# Patient Record
Sex: Female | Born: 1960 | Race: Black or African American | Hispanic: No | Marital: Married | Smoking: Never smoker
Health system: Southern US, Community
[De-identification: ages and names within clinical notes are randomized; demographics above are authoritative.]

## PROBLEM LIST (undated history)

## (undated) DIAGNOSIS — I10 Essential (primary) hypertension: Secondary | ICD-10-CM

## (undated) DIAGNOSIS — E119 Type 2 diabetes mellitus without complications: Secondary | ICD-10-CM

## (undated) DIAGNOSIS — E039 Hypothyroidism, unspecified: Secondary | ICD-10-CM

## (undated) HISTORY — PX: HERNIA REPAIR: SHX51

## (undated) HISTORY — PX: SHOULDER SURGERY: SHX246

---

## 1997-10-27 ENCOUNTER — Encounter: Admission: RE | Admit: 1997-10-27 | Discharge: 1997-10-27 | Payer: Self-pay | Admitting: Family Medicine

## 1997-11-02 ENCOUNTER — Encounter: Admission: RE | Admit: 1997-11-02 | Discharge: 1997-11-02 | Payer: Self-pay | Admitting: Sports Medicine

## 1997-11-22 ENCOUNTER — Encounter: Admission: RE | Admit: 1997-11-22 | Discharge: 1997-11-22 | Payer: Self-pay | Admitting: Family Medicine

## 1997-12-01 ENCOUNTER — Encounter: Admission: RE | Admit: 1997-12-01 | Discharge: 1997-12-01 | Payer: Self-pay | Admitting: Family Medicine

## 1997-12-08 ENCOUNTER — Encounter: Admission: RE | Admit: 1997-12-08 | Discharge: 1997-12-08 | Payer: Self-pay | Admitting: Family Medicine

## 1998-01-05 ENCOUNTER — Encounter: Admission: RE | Admit: 1998-01-05 | Discharge: 1998-01-05 | Payer: Self-pay | Admitting: Family Medicine

## 1998-01-15 ENCOUNTER — Emergency Department (HOSPITAL_COMMUNITY): Admission: EM | Admit: 1998-01-15 | Discharge: 1998-01-15 | Payer: Self-pay | Admitting: Family Medicine

## 1998-03-31 ENCOUNTER — Encounter: Admission: RE | Admit: 1998-03-31 | Discharge: 1998-03-31 | Payer: Self-pay | Admitting: Family Medicine

## 1998-03-31 ENCOUNTER — Emergency Department (HOSPITAL_COMMUNITY): Admission: EM | Admit: 1998-03-31 | Discharge: 1998-03-31 | Payer: Self-pay | Admitting: Emergency Medicine

## 1998-03-31 ENCOUNTER — Encounter: Payer: Self-pay | Admitting: Emergency Medicine

## 1998-04-08 ENCOUNTER — Encounter: Admission: RE | Admit: 1998-04-08 | Discharge: 1998-04-08 | Payer: Self-pay | Admitting: Family Medicine

## 1998-04-21 ENCOUNTER — Encounter: Admission: RE | Admit: 1998-04-21 | Discharge: 1998-04-21 | Payer: Self-pay | Admitting: Family Medicine

## 1998-05-13 ENCOUNTER — Encounter: Admission: RE | Admit: 1998-05-13 | Discharge: 1998-05-13 | Payer: Self-pay | Admitting: Family Medicine

## 1998-08-23 ENCOUNTER — Ambulatory Visit (HOSPITAL_BASED_OUTPATIENT_CLINIC_OR_DEPARTMENT_OTHER): Admission: RE | Admit: 1998-08-23 | Discharge: 1998-08-23 | Payer: Self-pay | Admitting: Orthopedic Surgery

## 1998-08-31 ENCOUNTER — Encounter: Admission: RE | Admit: 1998-08-31 | Discharge: 1998-11-30 | Payer: Self-pay | Admitting: Orthopedic Surgery

## 1998-12-02 ENCOUNTER — Encounter: Admission: RE | Admit: 1998-12-02 | Discharge: 1999-03-02 | Payer: Self-pay | Admitting: Orthopedic Surgery

## 1999-01-11 ENCOUNTER — Encounter: Admission: RE | Admit: 1999-01-11 | Discharge: 1999-01-11 | Payer: Self-pay | Admitting: Family Medicine

## 1999-01-12 ENCOUNTER — Encounter: Admission: RE | Admit: 1999-01-12 | Discharge: 1999-01-12 | Payer: Self-pay | Admitting: Family Medicine

## 1999-01-26 ENCOUNTER — Encounter: Admission: RE | Admit: 1999-01-26 | Discharge: 1999-01-26 | Payer: Self-pay | Admitting: Family Medicine

## 2000-06-14 ENCOUNTER — Encounter: Admission: RE | Admit: 2000-06-14 | Discharge: 2000-06-14 | Payer: Self-pay | Admitting: Family Medicine

## 2000-06-19 ENCOUNTER — Ambulatory Visit (HOSPITAL_COMMUNITY): Admission: RE | Admit: 2000-06-19 | Discharge: 2000-06-19 | Payer: Self-pay | Admitting: Gastroenterology

## 2000-06-21 ENCOUNTER — Encounter: Admission: RE | Admit: 2000-06-21 | Discharge: 2000-06-21 | Payer: Self-pay | Admitting: Family Medicine

## 2000-07-08 ENCOUNTER — Encounter: Admission: RE | Admit: 2000-07-08 | Discharge: 2000-07-08 | Payer: Self-pay | Admitting: Family Medicine

## 2000-12-06 ENCOUNTER — Ambulatory Visit (HOSPITAL_COMMUNITY): Admission: RE | Admit: 2000-12-06 | Discharge: 2000-12-06 | Payer: Self-pay | Admitting: Neurosurgery

## 2001-03-27 ENCOUNTER — Other Ambulatory Visit: Admission: RE | Admit: 2001-03-27 | Discharge: 2001-03-27 | Payer: Self-pay | Admitting: Endocrinology

## 2001-07-30 ENCOUNTER — Encounter: Admission: RE | Admit: 2001-07-30 | Discharge: 2001-07-30 | Payer: Self-pay | Admitting: Family Medicine

## 2001-08-18 ENCOUNTER — Encounter: Admission: RE | Admit: 2001-08-18 | Discharge: 2001-08-18 | Payer: Self-pay | Admitting: Family Medicine

## 2001-09-02 ENCOUNTER — Encounter: Admission: RE | Admit: 2001-09-02 | Discharge: 2001-09-02 | Payer: Self-pay | Admitting: Family Medicine

## 2001-09-08 ENCOUNTER — Encounter: Payer: Self-pay | Admitting: Sports Medicine

## 2001-09-08 ENCOUNTER — Encounter: Admission: RE | Admit: 2001-09-08 | Discharge: 2001-09-08 | Payer: Self-pay | Admitting: Sports Medicine

## 2001-09-30 ENCOUNTER — Encounter: Payer: Self-pay | Admitting: Sports Medicine

## 2001-09-30 ENCOUNTER — Encounter: Admission: RE | Admit: 2001-09-30 | Discharge: 2001-09-30 | Payer: Self-pay | Admitting: Sports Medicine

## 2001-10-03 ENCOUNTER — Encounter (INDEPENDENT_AMBULATORY_CARE_PROVIDER_SITE_OTHER): Payer: Self-pay | Admitting: Specialist

## 2001-10-03 ENCOUNTER — Ambulatory Visit (HOSPITAL_COMMUNITY): Admission: RE | Admit: 2001-10-03 | Discharge: 2001-10-03 | Payer: Self-pay | Admitting: Gastroenterology

## 2001-10-24 ENCOUNTER — Encounter: Admission: RE | Admit: 2001-10-24 | Discharge: 2001-10-24 | Payer: Self-pay | Admitting: Gastroenterology

## 2001-10-24 ENCOUNTER — Encounter: Payer: Self-pay | Admitting: Gastroenterology

## 2002-04-23 ENCOUNTER — Encounter (INDEPENDENT_AMBULATORY_CARE_PROVIDER_SITE_OTHER): Payer: Self-pay

## 2002-04-23 ENCOUNTER — Observation Stay (HOSPITAL_COMMUNITY): Admission: RE | Admit: 2002-04-23 | Discharge: 2002-04-24 | Payer: Self-pay | Admitting: *Deleted

## 2002-04-28 ENCOUNTER — Inpatient Hospital Stay (HOSPITAL_COMMUNITY): Admission: AD | Admit: 2002-04-28 | Discharge: 2002-04-28 | Payer: Self-pay | Admitting: Obstetrics and Gynecology

## 2002-04-28 ENCOUNTER — Encounter: Payer: Self-pay | Admitting: Obstetrics and Gynecology

## 2002-09-08 ENCOUNTER — Encounter: Admission: RE | Admit: 2002-09-08 | Discharge: 2002-09-08 | Payer: Self-pay | Admitting: Family Medicine

## 2002-10-08 ENCOUNTER — Encounter: Admission: RE | Admit: 2002-10-08 | Discharge: 2002-10-08 | Payer: Self-pay | Admitting: Family Medicine

## 2002-12-28 ENCOUNTER — Encounter: Admission: RE | Admit: 2002-12-28 | Discharge: 2002-12-28 | Payer: Self-pay | Admitting: Family Medicine

## 2003-03-18 DIAGNOSIS — Z9889 Other specified postprocedural states: Secondary | ICD-10-CM

## 2003-12-20 ENCOUNTER — Ambulatory Visit: Payer: Self-pay | Admitting: Sports Medicine

## 2004-01-07 ENCOUNTER — Ambulatory Visit: Payer: Self-pay | Admitting: Family Medicine

## 2005-01-24 ENCOUNTER — Ambulatory Visit: Payer: Self-pay | Admitting: Family Medicine

## 2005-01-26 ENCOUNTER — Ambulatory Visit: Payer: Self-pay | Admitting: Family Medicine

## 2005-08-17 ENCOUNTER — Ambulatory Visit: Payer: Self-pay | Admitting: Family Medicine

## 2005-08-27 ENCOUNTER — Ambulatory Visit: Payer: Self-pay | Admitting: Sports Medicine

## 2005-09-17 ENCOUNTER — Encounter (INDEPENDENT_AMBULATORY_CARE_PROVIDER_SITE_OTHER): Payer: Self-pay | Admitting: Gastroenterology

## 2005-09-17 ENCOUNTER — Ambulatory Visit (HOSPITAL_COMMUNITY): Admission: RE | Admit: 2005-09-17 | Discharge: 2005-09-17 | Payer: Self-pay | Admitting: Gastroenterology

## 2005-10-01 ENCOUNTER — Encounter: Admission: RE | Admit: 2005-10-01 | Discharge: 2005-10-01 | Payer: Self-pay | Admitting: Endocrinology

## 2005-10-19 ENCOUNTER — Ambulatory Visit: Payer: Self-pay | Admitting: Family Medicine

## 2006-01-10 ENCOUNTER — Encounter (INDEPENDENT_AMBULATORY_CARE_PROVIDER_SITE_OTHER): Payer: Self-pay | Admitting: *Deleted

## 2006-01-10 LAB — CONVERTED CEMR LAB

## 2006-01-22 ENCOUNTER — Encounter: Admission: RE | Admit: 2006-01-22 | Discharge: 2006-01-22 | Payer: Self-pay | Admitting: Endocrinology

## 2006-01-25 ENCOUNTER — Ambulatory Visit: Payer: Self-pay | Admitting: Family Medicine

## 2006-02-11 ENCOUNTER — Ambulatory Visit: Payer: Self-pay | Admitting: Sports Medicine

## 2006-02-20 ENCOUNTER — Ambulatory Visit (HOSPITAL_COMMUNITY): Admission: RE | Admit: 2006-02-20 | Discharge: 2006-02-21 | Payer: Self-pay | Admitting: General Surgery

## 2006-02-20 ENCOUNTER — Encounter (INDEPENDENT_AMBULATORY_CARE_PROVIDER_SITE_OTHER): Payer: Self-pay | Admitting: *Deleted

## 2006-03-17 DIAGNOSIS — M5412 Radiculopathy, cervical region: Secondary | ICD-10-CM | POA: Insufficient documentation

## 2006-05-09 DIAGNOSIS — I10 Essential (primary) hypertension: Secondary | ICD-10-CM

## 2006-05-09 DIAGNOSIS — K5730 Diverticulosis of large intestine without perforation or abscess without bleeding: Secondary | ICD-10-CM | POA: Insufficient documentation

## 2006-05-10 ENCOUNTER — Encounter (INDEPENDENT_AMBULATORY_CARE_PROVIDER_SITE_OTHER): Payer: Self-pay | Admitting: *Deleted

## 2006-05-27 ENCOUNTER — Telehealth: Payer: Self-pay | Admitting: *Deleted

## 2006-11-13 ENCOUNTER — Ambulatory Visit: Payer: Self-pay | Admitting: Family Medicine

## 2006-11-13 ENCOUNTER — Encounter (INDEPENDENT_AMBULATORY_CARE_PROVIDER_SITE_OTHER): Payer: Self-pay | Admitting: Family Medicine

## 2006-11-13 ENCOUNTER — Telehealth (INDEPENDENT_AMBULATORY_CARE_PROVIDER_SITE_OTHER): Payer: Self-pay | Admitting: *Deleted

## 2006-11-13 LAB — CONVERTED CEMR LAB
ALT: 49 units/L — ABNORMAL HIGH (ref 0–35)
AST: 31 units/L (ref 0–37)
Alkaline Phosphatase: 57 units/L (ref 39–117)
Bilirubin Urine: NEGATIVE
Creatinine, Ser: 1.15 mg/dL (ref 0.40–1.20)
Glucose, Bld: 350 mg/dL — ABNORMAL HIGH (ref 70–99)
Glucose, Urine, Semiquant: 500
HCT: 41.8 % (ref 36.0–46.0)
Hemoglobin: 13.8 g/dL (ref 12.0–15.0)
Ketones, urine, test strip: NEGATIVE
MCHC: 33 g/dL (ref 30.0–36.0)
MCV: 91.3 fL (ref 78.0–100.0)
RBC: 4.58 M/uL (ref 3.87–5.11)
RDW: 13.3 % (ref 11.5–14.0)
TSH: 1.176 microintl units/mL (ref 0.350–5.50)
Total Bilirubin: 0.5 mg/dL (ref 0.3–1.2)
Total Protein: 7.1 g/dL (ref 6.0–8.3)
Urobilinogen, UA: 0.2
pH: 5.5

## 2006-11-19 ENCOUNTER — Encounter (INDEPENDENT_AMBULATORY_CARE_PROVIDER_SITE_OTHER): Payer: Self-pay | Admitting: Family Medicine

## 2006-11-19 ENCOUNTER — Telehealth (INDEPENDENT_AMBULATORY_CARE_PROVIDER_SITE_OTHER): Payer: Self-pay | Admitting: Family Medicine

## 2006-11-19 ENCOUNTER — Ambulatory Visit: Payer: Self-pay | Admitting: Family Medicine

## 2006-11-19 DIAGNOSIS — E119 Type 2 diabetes mellitus without complications: Secondary | ICD-10-CM

## 2006-11-20 LAB — CONVERTED CEMR LAB
CO2: 29 meq/L (ref 19–32)
Calcium: 8.8 mg/dL (ref 8.4–10.5)
Cholesterol, target level: 200 mg/dL
Glucose, Bld: 258 mg/dL — ABNORMAL HIGH (ref 70–99)
LDL Goal: 100 mg/dL
Sodium: 138 meq/L (ref 135–145)
Total CHOL/HDL Ratio: 4.8

## 2006-12-03 ENCOUNTER — Encounter: Admission: RE | Admit: 2006-12-03 | Discharge: 2007-02-19 | Payer: Self-pay | Admitting: Family Medicine

## 2006-12-03 ENCOUNTER — Telehealth (INDEPENDENT_AMBULATORY_CARE_PROVIDER_SITE_OTHER): Payer: Self-pay | Admitting: Family Medicine

## 2006-12-06 ENCOUNTER — Encounter (INDEPENDENT_AMBULATORY_CARE_PROVIDER_SITE_OTHER): Payer: Self-pay | Admitting: *Deleted

## 2006-12-08 ENCOUNTER — Emergency Department (HOSPITAL_COMMUNITY): Admission: EM | Admit: 2006-12-08 | Discharge: 2006-12-08 | Payer: Self-pay | Admitting: Emergency Medicine

## 2006-12-16 ENCOUNTER — Encounter (INDEPENDENT_AMBULATORY_CARE_PROVIDER_SITE_OTHER): Payer: Self-pay | Admitting: Family Medicine

## 2006-12-16 ENCOUNTER — Ambulatory Visit: Payer: Self-pay | Admitting: Family Medicine

## 2006-12-16 ENCOUNTER — Encounter (INDEPENDENT_AMBULATORY_CARE_PROVIDER_SITE_OTHER): Payer: Self-pay | Admitting: *Deleted

## 2006-12-18 ENCOUNTER — Encounter (INDEPENDENT_AMBULATORY_CARE_PROVIDER_SITE_OTHER): Payer: Self-pay | Admitting: Family Medicine

## 2006-12-24 ENCOUNTER — Encounter: Payer: Self-pay | Admitting: *Deleted

## 2006-12-31 ENCOUNTER — Encounter (INDEPENDENT_AMBULATORY_CARE_PROVIDER_SITE_OTHER): Payer: Self-pay | Admitting: Family Medicine

## 2007-01-23 ENCOUNTER — Encounter (INDEPENDENT_AMBULATORY_CARE_PROVIDER_SITE_OTHER): Payer: Self-pay | Admitting: Family Medicine

## 2007-01-27 ENCOUNTER — Encounter (INDEPENDENT_AMBULATORY_CARE_PROVIDER_SITE_OTHER): Payer: Self-pay | Admitting: Family Medicine

## 2007-02-03 ENCOUNTER — Telehealth: Payer: Self-pay | Admitting: *Deleted

## 2007-02-21 ENCOUNTER — Encounter: Admission: RE | Admit: 2007-02-21 | Discharge: 2007-02-22 | Payer: Self-pay | Admitting: Family Medicine

## 2007-02-21 ENCOUNTER — Ambulatory Visit: Payer: Self-pay | Admitting: Family Medicine

## 2007-02-21 DIAGNOSIS — E039 Hypothyroidism, unspecified: Secondary | ICD-10-CM | POA: Insufficient documentation

## 2007-02-21 LAB — CONVERTED CEMR LAB: Hgb A1c MFr Bld: 6.3 %

## 2007-02-24 ENCOUNTER — Encounter (INDEPENDENT_AMBULATORY_CARE_PROVIDER_SITE_OTHER): Payer: Self-pay | Admitting: Family Medicine

## 2007-03-18 ENCOUNTER — Ambulatory Visit: Payer: Self-pay | Admitting: Family Medicine

## 2007-03-18 ENCOUNTER — Encounter (INDEPENDENT_AMBULATORY_CARE_PROVIDER_SITE_OTHER): Payer: Self-pay | Admitting: Family Medicine

## 2007-03-19 ENCOUNTER — Encounter: Admission: RE | Admit: 2007-03-19 | Discharge: 2007-03-19 | Payer: Self-pay | Admitting: Family Medicine

## 2007-03-21 ENCOUNTER — Telehealth (INDEPENDENT_AMBULATORY_CARE_PROVIDER_SITE_OTHER): Payer: Self-pay | Admitting: Family Medicine

## 2007-03-25 ENCOUNTER — Telehealth (INDEPENDENT_AMBULATORY_CARE_PROVIDER_SITE_OTHER): Payer: Self-pay | Admitting: Family Medicine

## 2007-03-26 ENCOUNTER — Encounter (INDEPENDENT_AMBULATORY_CARE_PROVIDER_SITE_OTHER): Payer: Self-pay | Admitting: Family Medicine

## 2007-04-05 ENCOUNTER — Encounter: Admission: RE | Admit: 2007-04-05 | Discharge: 2007-04-05 | Payer: Self-pay | Admitting: Neurosurgery

## 2007-04-10 ENCOUNTER — Encounter: Admission: RE | Admit: 2007-04-10 | Discharge: 2007-05-22 | Payer: Self-pay | Admitting: Family Medicine

## 2007-04-16 ENCOUNTER — Encounter (INDEPENDENT_AMBULATORY_CARE_PROVIDER_SITE_OTHER): Payer: Self-pay | Admitting: Family Medicine

## 2007-05-23 ENCOUNTER — Encounter (INDEPENDENT_AMBULATORY_CARE_PROVIDER_SITE_OTHER): Payer: Self-pay | Admitting: Family Medicine

## 2007-05-30 ENCOUNTER — Encounter (INDEPENDENT_AMBULATORY_CARE_PROVIDER_SITE_OTHER): Payer: Self-pay | Admitting: Family Medicine

## 2007-06-02 ENCOUNTER — Encounter (INDEPENDENT_AMBULATORY_CARE_PROVIDER_SITE_OTHER): Payer: Self-pay | Admitting: Family Medicine

## 2007-06-02 ENCOUNTER — Ambulatory Visit: Payer: Self-pay | Admitting: Family Medicine

## 2007-06-02 LAB — CONVERTED CEMR LAB: Hgb A1c MFr Bld: 5.5 %

## 2007-06-03 ENCOUNTER — Encounter (INDEPENDENT_AMBULATORY_CARE_PROVIDER_SITE_OTHER): Payer: Self-pay | Admitting: Family Medicine

## 2007-06-03 LAB — CONVERTED CEMR LAB: Direct LDL: 119 mg/dL — ABNORMAL HIGH

## 2007-07-15 ENCOUNTER — Telehealth: Payer: Self-pay | Admitting: *Deleted

## 2007-07-23 ENCOUNTER — Encounter (INDEPENDENT_AMBULATORY_CARE_PROVIDER_SITE_OTHER): Payer: Self-pay | Admitting: Family Medicine

## 2007-07-23 ENCOUNTER — Ambulatory Visit: Payer: Self-pay | Admitting: Family Medicine

## 2007-07-23 ENCOUNTER — Encounter: Admission: RE | Admit: 2007-07-23 | Discharge: 2007-09-11 | Payer: Self-pay | Admitting: Family Medicine

## 2007-07-24 ENCOUNTER — Telehealth (INDEPENDENT_AMBULATORY_CARE_PROVIDER_SITE_OTHER): Payer: Self-pay | Admitting: Family Medicine

## 2007-08-20 ENCOUNTER — Ambulatory Visit: Payer: Self-pay | Admitting: Family Medicine

## 2007-08-20 ENCOUNTER — Encounter (INDEPENDENT_AMBULATORY_CARE_PROVIDER_SITE_OTHER): Payer: Self-pay | Admitting: Family Medicine

## 2007-08-20 DIAGNOSIS — E785 Hyperlipidemia, unspecified: Secondary | ICD-10-CM

## 2007-08-25 ENCOUNTER — Encounter (INDEPENDENT_AMBULATORY_CARE_PROVIDER_SITE_OTHER): Payer: Self-pay | Admitting: Family Medicine

## 2007-09-03 ENCOUNTER — Encounter (INDEPENDENT_AMBULATORY_CARE_PROVIDER_SITE_OTHER): Payer: Self-pay | Admitting: *Deleted

## 2007-09-25 ENCOUNTER — Ambulatory Visit: Payer: Self-pay | Admitting: Family Medicine

## 2007-09-26 ENCOUNTER — Encounter: Payer: Self-pay | Admitting: Family Medicine

## 2007-11-11 ENCOUNTER — Emergency Department (HOSPITAL_COMMUNITY): Admission: EM | Admit: 2007-11-11 | Discharge: 2007-11-11 | Payer: Self-pay | Admitting: Emergency Medicine

## 2007-11-20 ENCOUNTER — Encounter: Admission: RE | Admit: 2007-11-20 | Discharge: 2007-11-20 | Payer: Self-pay | Admitting: Family Medicine

## 2007-11-20 ENCOUNTER — Encounter: Payer: Self-pay | Admitting: Family Medicine

## 2007-12-09 ENCOUNTER — Telehealth: Payer: Self-pay | Admitting: *Deleted

## 2007-12-27 ENCOUNTER — Emergency Department (HOSPITAL_COMMUNITY): Admission: EM | Admit: 2007-12-27 | Discharge: 2007-12-27 | Payer: Self-pay | Admitting: Family Medicine

## 2008-01-02 ENCOUNTER — Encounter: Payer: Self-pay | Admitting: Family Medicine

## 2008-01-02 ENCOUNTER — Telehealth: Payer: Self-pay | Admitting: *Deleted

## 2008-01-02 ENCOUNTER — Ambulatory Visit: Payer: Self-pay | Admitting: Family Medicine

## 2008-01-02 LAB — CONVERTED CEMR LAB: Rapid Strep: NEGATIVE

## 2008-01-06 LAB — CONVERTED CEMR LAB
Albumin: 4.2 g/dL (ref 3.5–5.2)
Alkaline Phosphatase: 47 units/L (ref 39–117)
BUN: 16 mg/dL (ref 6–23)
Calcium: 9.1 mg/dL (ref 8.4–10.5)
Direct LDL: 89 mg/dL
Eosinophils Absolute: 0.1 10*3/uL (ref 0.0–0.7)
Glucose, Bld: 142 mg/dL — ABNORMAL HIGH (ref 70–99)
HCT: 40 % (ref 36.0–46.0)
Lymphs Abs: 3.6 10*3/uL (ref 0.7–4.0)
MCV: 91.3 fL (ref 78.0–100.0)
Monocytes Relative: 6 % (ref 3–12)
Neutrophils Relative %: 47 % (ref 43–77)
Potassium: 4.2 meq/L (ref 3.5–5.3)
RBC: 4.38 M/uL (ref 3.87–5.11)
WBC: 7.8 10*3/uL (ref 4.0–10.5)

## 2008-01-09 ENCOUNTER — Ambulatory Visit: Payer: Self-pay | Admitting: Family Medicine

## 2008-01-09 DIAGNOSIS — M722 Plantar fascial fibromatosis: Secondary | ICD-10-CM | POA: Insufficient documentation

## 2008-01-21 ENCOUNTER — Encounter: Payer: Self-pay | Admitting: *Deleted

## 2008-03-10 ENCOUNTER — Encounter: Admission: RE | Admit: 2008-03-10 | Discharge: 2008-03-10 | Payer: Self-pay | Admitting: Family Medicine

## 2008-03-10 ENCOUNTER — Encounter: Payer: Self-pay | Admitting: Family Medicine

## 2008-04-12 ENCOUNTER — Emergency Department (HOSPITAL_COMMUNITY): Admission: EM | Admit: 2008-04-12 | Discharge: 2008-04-12 | Payer: Self-pay | Admitting: Family Medicine

## 2008-05-13 ENCOUNTER — Encounter: Admission: RE | Admit: 2008-05-13 | Discharge: 2008-05-13 | Payer: Self-pay | Admitting: Family Medicine

## 2008-05-13 ENCOUNTER — Encounter: Payer: Self-pay | Admitting: Family Medicine

## 2008-06-09 ENCOUNTER — Emergency Department (HOSPITAL_COMMUNITY): Admission: EM | Admit: 2008-06-09 | Discharge: 2008-06-09 | Payer: Self-pay | Admitting: Emergency Medicine

## 2008-07-15 ENCOUNTER — Ambulatory Visit: Payer: Self-pay | Admitting: Family Medicine

## 2008-07-15 DIAGNOSIS — R5383 Other fatigue: Secondary | ICD-10-CM

## 2008-07-15 DIAGNOSIS — R5381 Other malaise: Secondary | ICD-10-CM | POA: Insufficient documentation

## 2008-07-21 ENCOUNTER — Encounter: Payer: Self-pay | Admitting: Family Medicine

## 2008-07-21 ENCOUNTER — Ambulatory Visit: Payer: Self-pay | Admitting: Family Medicine

## 2008-07-21 ENCOUNTER — Ambulatory Visit: Payer: Self-pay | Admitting: Sports Medicine

## 2008-07-21 DIAGNOSIS — M2140 Flat foot [pes planus] (acquired), unspecified foot: Secondary | ICD-10-CM | POA: Insufficient documentation

## 2008-07-21 LAB — CONVERTED CEMR LAB
Alkaline Phosphatase: 49 units/L (ref 39–117)
BUN: 17 mg/dL (ref 6–23)
Creatinine, Ser: 0.79 mg/dL (ref 0.40–1.20)
Direct LDL: 99 mg/dL
Glucose, Bld: 150 mg/dL — ABNORMAL HIGH (ref 70–99)
Total Bilirubin: 0.3 mg/dL (ref 0.3–1.2)

## 2008-07-22 ENCOUNTER — Encounter: Payer: Self-pay | Admitting: Family Medicine

## 2008-07-22 ENCOUNTER — Encounter: Admission: RE | Admit: 2008-07-22 | Discharge: 2008-07-22 | Payer: Self-pay | Admitting: Anesthesiology

## 2008-08-05 ENCOUNTER — Encounter: Payer: Self-pay | Admitting: Family Medicine

## 2008-08-05 ENCOUNTER — Ambulatory Visit: Payer: Self-pay | Admitting: Family Medicine

## 2008-08-05 DIAGNOSIS — K625 Hemorrhage of anus and rectum: Secondary | ICD-10-CM

## 2008-08-05 LAB — CONVERTED CEMR LAB: Chlamydia, DNA Probe: NEGATIVE

## 2008-08-11 ENCOUNTER — Encounter: Payer: Self-pay | Admitting: Family Medicine

## 2008-08-19 ENCOUNTER — Encounter: Payer: Self-pay | Admitting: Family Medicine

## 2008-10-04 ENCOUNTER — Ambulatory Visit: Payer: Self-pay | Admitting: Family Medicine

## 2008-10-04 ENCOUNTER — Telehealth: Payer: Self-pay | Admitting: Family Medicine

## 2008-10-06 ENCOUNTER — Ambulatory Visit: Payer: Self-pay | Admitting: Family Medicine

## 2008-10-18 ENCOUNTER — Ambulatory Visit: Payer: Self-pay | Admitting: Family Medicine

## 2008-10-21 ENCOUNTER — Ambulatory Visit: Payer: Self-pay | Admitting: Family Medicine

## 2008-11-01 ENCOUNTER — Encounter: Payer: Self-pay | Admitting: Family Medicine

## 2008-11-30 ENCOUNTER — Ambulatory Visit: Payer: Self-pay | Admitting: Family Medicine

## 2008-12-09 ENCOUNTER — Telehealth: Payer: Self-pay | Admitting: *Deleted

## 2009-04-22 ENCOUNTER — Ambulatory Visit: Payer: Self-pay | Admitting: Family Medicine

## 2009-04-22 DIAGNOSIS — M549 Dorsalgia, unspecified: Secondary | ICD-10-CM | POA: Insufficient documentation

## 2009-04-22 LAB — CONVERTED CEMR LAB: Hgb A1c MFr Bld: 7 %

## 2009-05-19 ENCOUNTER — Telehealth: Payer: Self-pay | Admitting: Family Medicine

## 2009-09-01 ENCOUNTER — Telehealth (INDEPENDENT_AMBULATORY_CARE_PROVIDER_SITE_OTHER): Payer: Self-pay | Admitting: *Deleted

## 2009-09-29 ENCOUNTER — Ambulatory Visit: Payer: Self-pay | Admitting: Family Medicine

## 2009-10-12 ENCOUNTER — Encounter: Payer: Self-pay | Admitting: Family Medicine

## 2009-10-12 ENCOUNTER — Ambulatory Visit: Payer: Self-pay | Admitting: Family Medicine

## 2009-10-12 LAB — CONVERTED CEMR LAB
ALT: 33 units/L (ref 0–35)
BUN: 13 mg/dL (ref 6–23)
CO2: 30 meq/L (ref 19–32)
Calcium: 9.1 mg/dL (ref 8.4–10.5)
Creatinine, Ser: 0.88 mg/dL (ref 0.40–1.20)
HDL: 33 mg/dL — ABNORMAL LOW (ref >39)
LDL Cholesterol: 84 mg/dL (ref 0–99)
Potassium: 4.1 meq/L (ref 3.5–5.3)
Sodium: 139 meq/L (ref 135–145)
TSH: 1.302 microintl units/mL (ref 0.350–4.500)
Total Bilirubin: 0.4 mg/dL (ref 0.3–1.2)
Total CHOL/HDL Ratio: 4

## 2009-10-13 ENCOUNTER — Encounter: Payer: Self-pay | Admitting: Family Medicine

## 2010-03-30 ENCOUNTER — Ambulatory Visit
Admission: RE | Admit: 2010-03-30 | Discharge: 2010-03-30 | Payer: Self-pay | Source: Home / Self Care | Attending: Family Medicine | Admitting: Family Medicine

## 2010-03-30 DIAGNOSIS — L738 Other specified follicular disorders: Secondary | ICD-10-CM | POA: Insufficient documentation

## 2010-03-30 LAB — CONVERTED CEMR LAB: Hgb A1c MFr Bld: 6.6 %

## 2010-04-01 ENCOUNTER — Encounter: Payer: Self-pay | Admitting: Sports Medicine

## 2010-04-02 ENCOUNTER — Encounter: Payer: Self-pay | Admitting: Family Medicine

## 2010-04-11 NOTE — Progress Notes (Signed)
Summary: meds prob  Phone Note Call from Patient Call back at [phone]   Caller: Patient Summary of Call: Metformin needs to be rewritten for more than 30 pills a month - she takes 2 per day of the 1000mg .  [facility] Initial call taken by: De Nurse,  May 19, 2009 10:27 AM  Follow-up for Phone Call        to pcp Follow-up by: Golden Circle RN,  May 19, 2009 10:32 AM  Additional Follow-up for Phone Call Additional follow up Details #1::        sent to pharmacy Additional Follow-up by: Delbert Harness MD,  May 19, 2009 11:32 PM    New/Updated Medications: METFORMIN HCL 1000 MG TABS (METFORMIN HCL) take one tablet twice a day Prescriptions: METFORMIN HCL 1000 MG TABS (METFORMIN HCL) take one tablet twice a day  #60 x 3   Entered and Authorized by:   Delbert Harness MD   Signed by:   Delbert Harness MD on 05/19/2009   Method used:   Electronically to        [facility] (retail)       [address]       [place], [place]  [address]       Ph: [phone]       Fax: [phone]   RxID:   [id number]

## 2010-04-11 NOTE — Progress Notes (Signed)
Summary: Rx Req  Phone Note Refill Request Call back at Home Phone [phone]   Refills Requested: Medication #1:  SYNTHROID 125 MCG  TABS Take 1 tablet daily PT IS OUT CAN IT BE SENT IN TODAY.   PHARMACY IS [facility].  Initial call taken by: Clydell Hakim,  September 01, 2009 1:52 PM  Follow-up for Phone Call        will forward to MD. Follow-up by: Theresia Lo RN,  September 01, 2009 2:08 PM    Prescriptions: SYNTHROID 125 MCG  TABS (LEVOTHYROXINE SODIUM) Take 1 tablet daily  #30 x 1   Entered and Authorized by:   Delbert Harness MD   Signed by:   Delbert Harness MD on 09/01/2009   Method used:   Electronically to        [facility] (retail)       [address]       [place], [place]  [address]       Ph: [phone]       Fax: [phone]   RxID:   [id number]  message left with husband that rx has been sent to pharmacy. Theresia Lo RN  September 01, 2009 2:28 PM

## 2010-04-11 NOTE — Assessment & Plan Note (Signed)
Summary: f/up,tcb   Vital Signs:  Patient profile:   50 year old female Height:      64 inches Weight:      272.2 pounds BMI:     46.89 Temp:     97.8 degrees F oral Pulse rate:   69 / minute BP sitting:   145 / 89  (right arm) Cuff size:   large  Vitals Entered By: Garen Grams LPN (April 22, 2009 11:54 AM) CC: f/u dm Is Patient Diabetic? Yes Did you bring your meter with you today? No Pain Assessment Patient in pain? yes     Location: lower back   Primary Care Provider:  Delbert Harness MD  CC:  f/u dm.  History of Present Illness: 50 yo here for follow-up  DM:  fasting blood sugars- 115-140's.  No longer going to diabetes education due to missed appointment was discharged. Last eye exam: June 2010. No significant contribution to exercise or diet.  But says her schedule has been tough.  HYPERTENSION Meds: Taking and tolerating? yes Home BP's: No Chest Pain:No Dyspnea: No  Low back pain x 1-2 weeks.  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  Easing off with advil.  No radiating pain.     Habits & Providers  Alcohol-Tobacco-Diet     Tobacco Status: never  Current Medications (verified): 1)  Freestyle Lite   Strp (Glucose Blood) .... Check Sugars Twice A Day, Once Before Breakfast, Once Before Bed 2)  Ultra Thin Lancets   Misc (Lancets) .... Use With To Check Sugars 3)  Metformin Hcl 1000 Mg Tabs (Metformin Hcl) .... Take One Tablet Twice A Day 4)  Lisinopril-Hydrochlorothiazide 20-12.5 Mg  Tabs (Lisinopril-Hydrochlorothiazide) .... Two Tablets Daily For Blood Pressure Control and Kidney Protection 5)  Pravachol 40 Mg Tabs (Pravastatin Sodium) .... Take Two Tablets Daily 6)  Synthroid 125 Mcg  Tabs (Levothyroxine Sodium) .... Take 1 Tablet Daily 7)  Meloxicam 15 Mg Tabs (Meloxicam) .... Take One Half To One Tablet Daily For Back Pain  Allergies: 1)  ! Percocet (Oxycodone-Acetaminophen) PMH-FH-SH reviewed-no changes except otherwise  noted  Review of Systems      See HPI General:  Denies fever, loss of appetite, and weight loss. CV:  Denies chest pain or discomfort, difficulty breathing at night, difficulty breathing while lying down, lightheadness, near fainting, and swelling of feet. Resp:  Denies cough, shortness of breath, and sputum productive. GI:  Denies abdominal pain and change in bowel habits. MS:  Complains of low back pain.  Physical Exam  General:  Well-developed,well-nourished,in no acute distress; alert,appropriate and cooperative throughout examination Lungs:  Normal respiratory effort, chest expands symmetrically. Lungs are clear to auscultation, no crackles or wheezes. Heart:  Normal rate and regular rhythm. S1 and S2 normal without gallop, murmur, click, rub or other extra sounds. Abdomen:  Bowel sounds positive,abdomen soft and non-tender without masses, organomegaly or hernias noted.  Obese. Msk:  tenderness to palpation along paraspinal muscle in lumbar spine.  No midline tenderness.  Comfortable appearing.  Able to ambulate, sit up without pain or radiation. Neurologic:  cranial nerves II-XII grossly intact.     Impression & Recommendations:  Problem # 1:  DM, UNCOMPLICATED, TYPE II (ICD-250.00) Continued increased in HgA1C now 7.  reviewed goals again.  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  Discussed again importance of lifestyle modification and self-monitoring in disease.  Ptient plans on thinking about what might be feasible to fit into her life.  Will increase metformin today.  Her updated medication list for this problem includes:    Metformin Hcl 1000 Mg Tabs (Metformin hcl) .[facility]... Take one tablet twice a day    Lisinopril-hydrochlorothiazide 20-12.5 Mg Tabs (Lisinopril-hydrochlorothiazide) .[facility] Two tablets daily for blood pressure control and kidney protection  Orders: A1C-[facility] (11914) [facility]- Est  Level  4 ([address]295)  Labs Reviewed: Creat: 0.79 (07/21/2008)   Microalbumin: trace (11/30/2008)  Last Eye Exam: normal (08/19/2008) Reviewed HgBA1c results: 7.0 (04/22/2009)  6.6 (11/30/2008)  Problem # 2:  HYPERTENSION, BENIGN SYSTEMIC (ICD-401.1)  Above goal.  Patient woudl like towork on lifestyle modification.  Was well controlled prior.  Will need to increase mediciations at next visit.  Given instructions to check BP and keep log and bring to next appt.  Her updated medication list for this problem includes:    Lisinopril-hydrochlorothiazide 20-12.5 Mg Tabs (Lisinopril-hydrochlorothiazide) .Marland Kitchen..[facility] Kitchen Two tablets daily for blood pressure control and kidney protection  BP today: 145/89 Prior BP: 109/70 (11/30/2008)  Prior 10 Yr Risk Heart Disease: 11 % (12/16/2006)  Labs Reviewed: K+: 4.4 (07/21/2008) Creat: : 0.79 (07/21/2008)   Chol: 183 (11/19/2006)   HDL: 38 (11/19/2006)   LDL: 126 (11/19/2006)   TG: 96 (11/19/2006)  Orders: [facility]- Est  Level 4 ([address])  Problem # 3:  BACK PAIN, ACUTE (ICD-724.5)  Muscle strain.  Advised NSAIDS and after acute episode need to work on core stregth and conditioning- given handout.  The following medications were removed from the medication list:    Tramadol Hcl 50 Mg Tabs (Tramadol hcl) ..... Qid  take with water Her updated medication list for this problem includes:    Meloxicam 15 Mg Tabs (Meloxicam) .[facility] Kitchen... Take one half to one tablet daily for back pain  Orders: [facility]- Est  Level 4 ([address])  Complete Medication List: 1)  Freestyle Lite Strp (Glucose blood) .... Check sugars twice a day, once before breakfast, once before bed 2)  Ultra Thin Lancets Misc (Lancets) .... Use with to check sugars 3)  Metformin Hcl 1000 Mg Tabs (Metformin hcl) .... Take one tablet twice a day 4)  Lisinopril-hydrochlorothiazide 20-12.5 Mg Tabs (Lisinopril-hydrochlorothiazide) .... Two tablets daily for blood pressure control and kidney protection 5)  Pravachol 40 Mg Tabs  (Pravastatin sodium) .... Take two tablets daily 6)  Synthroid 125 Mcg Tabs (Levothyroxine sodium) .... Take 1 tablet daily 7)  Meloxicam 15 Mg Tabs (Meloxicam) .... Take one half to one tablet daily for back pain  Patient Instructions: 1)  Continue working on healthy diet decreasing simple carbs, increasing complex carbs and vegetables. 2)  Think about your exercise goals, and what you would be willing to commit to. 3)  See exercises for low back pain to help increase your core strength. 4)  Work on Producer, television/film/video as much about your diabetes and blood pressure as you can- start with what your goals are: see handout. 5)  Follow- up 3 months and at that time we will do fasting labs. 6)  May use meloxicam for back pain. 7)  increase metformin to 1000 mg twice a day Prescriptions: METFORMIN HCL 1000 MG TABS (METFORMIN HCL) take one tablet twice a day  #30 x 3   Entered and Authorized by:   Delbert Harness MD   Signed by:   Delbert Harness MD on 04/22/2009   Method used:   Electronically to        [facility]       [address]       [place], [place]  [address]       Ph: [phone]       Fax: [phone]   RxID:   [id number] MELOXICAM 15 MG TABS (MELOXICAM) take one half to one tablet daily for back pain  #15 x 0   Entered and Authorized by:   Delbert Harness MD   Signed by:   Delbert Harness MD on 04/22/2009   Method used:   Electronically to        [facility] (retail)       [address]       [place], [place]  [address]       Ph: [phone]       Fax: [phone]   RxID:   [id number]     Laboratory Results   Blood Tests   Date/Time Received: April 22, 2009 12:00 PM  Date/Time Reported: April 22, 2009 12:27 PM   HGBA1C: 7.0%   (Normal Range: Non-Diabetic - 3-6%   Control Diabetic - 6-8%)  Comments: ...........test performed by...........Marland KitchenTerese Door, CMA  Date/Time Received:     Prevention &  Chronic Care Immunizations   Influenza vaccine: Fluvax Non-MCR  (01/09/2008)   Influenza vaccine due: 12/16/2007    Tetanus booster: 07/10/2005: Done.   Tetanus booster due: 07/11/2015    Pneumococcal vaccine: Pneumovax  (01/09/2008)   Pneumococcal vaccine due: None  Other Screening   Pap smear: NEGATIVE FOR INTRAEPITHELIAL LESIONS OR MALIGNANCY.  (08/05/2008)   Pap smear due: 01/11/2007    Mammogram: Normal  (03/21/2007)   Mammogram due: 03/20/2008   Smoking status: never  (04/22/2009)  Diabetes Mellitus   HgbA1C: 7.0  (04/22/2009)   HgbA1C action/deferral: Ordered  (11/30/2008)   Hemoglobin A1C due: 04/03/2008    Eye exam: normal  (08/19/2008)   Eye exam due: 08/19/2009    Foot exam: yes  (07/15/2008)   Foot exam action/deferral: Do today   High risk foot: Not documented   Foot care education: Not documented   Foot exam due: 07/22/2008    Urine microalbumin/creatinine ratio: Not documented   Urine microalbumin action/deferral: Ordered   Urine microalbumin/cr due: 02/21/2008  Lipids   Total Cholesterol: 183  (11/19/2006)   LDL: 126  (11/19/2006)   LDL Direct: 99  (07/21/2008)   HDL: 38  (11/19/2006)   Triglycerides: 96  (11/19/2006)    SGOT (AST): 24  (07/21/2008)   SGPT (ALT): 37  (07/21/2008)   Alkaline phosphatase: 49  (07/21/2008)   Total bilirubin: 0.3  (07/21/2008)  Hypertension   Last Blood Pressure: 145 / 89  (04/22/2009)   Serum creatinine: 0.79  (07/21/2008)   Serum potassium 4.4  (07/21/2008)    Hypertension flowsheet reviewed?: Yes  Self-Management Support :   Personal Goals (by the next clinic visit) :     Personal A1C goal: 7  (11/30/2008)     Personal blood pressure goal: 130/80  (11/30/2008)     Personal LDL goal: 100  (11/30/2008)    Patient will work on the following items until the next clinic visit to reach self-care goals:     Medications and monitoring: take my medicines every day, check my blood sugar, check my blood pressure,  weigh myself weekly  (04/22/2009)     Eating: drink diet soda or water instead of juice or soda,  eat more vegetables, eat foods that are low in salt  (04/22/2009)     Activity: take a 30 minute walk every day, take the stairs instead of the elevator, park at the far end of the parking lot  (04/22/2009)    Diabetes self-management support: Education handout, Written self-care plan  (04/22/2009)   Diabetes care plan printed   Diabetes education handout printed    Hypertension self-management support: Written self-care plan, Education handout  (04/22/2009)   Hypertension self-care plan printed.   Hypertension education handout printed    Lipid self-management support: Written self-care plan  (04/22/2009)   Lipid self-care plan printed.

## 2010-04-11 NOTE — Letter (Signed)
Summary: Lipid Letter  [facility]  [address]   [place], [place] [address]   Phone: [phone]  Fax: [phone]    10/13/2009  Christie Estrada [address] [place], [place]  [address]  Dear Christie Estrada:  We have carefully reviewed your last lipid profile  and the results are noted below with a summary of recommendations for lipid management.  Most important, I would focus on daily exercise, weight reduction.  You may consider adding a daily fish oil supplement.  I look forward to discussing this further at your next visit.    HDL "good" Cholesterol:   33     Goal: >40   LDL "bad" Cholesterol:   84     Goal: <100    Your lipid goals have not been met; we recommend improving on your TLC diet and Adjunctive Measures (see below) and make the following changes to your regimen:    TLC Diet (Therapeutic Lifestyle Change): Saturated Fats & Transfatty acids should be kept < 7% of total calories ***Reduce Saturated Fats Polyunstaurated Fat can be up to 10% of total calories Monounsaturated Fat Fat can be up to 20% of total calories Total Fat should be no greater than 25-35% of total calories Carbohydrates should be 50-60% of total calories Protein should be approximately 15% of total calories Fiber should be at least 20-30 grams a day ***Increased fiber may help lower LDL Total Cholesterol should be < 200mg /day Consider adding plant stanol/sterols to diet (example: Benacol spread) ***A higher intake of unsaturated fat may reduce Triglycerides and Increase HDL    Adjunctive Measures (may lower LIPIDS and reduce risk of Heart Attack) include: Aerobic Exercise (20-30 minutes 3-4 times a week) Limit Alcohol Consumption Weight Reduction Aspirin 75-81 mg a day by mouth (if not allergic or contraindicated) Dietary Fiber 20-30 grams a day by mouth    Current Medications: 1)    Freestyle Lite   Strp (Glucose blood) .... Check sugars twice a day, once before  breakfast, once before bed 2)    Ultra Thin Lancets   Misc (Lancets) .... Use with to check sugars 3)    Metformin Hcl 1000 Mg Tabs (Metformin hcl) .... Take one tablet twice a day 4)    Lisinopril-hydrochlorothiazide 20-12.5 Mg  Tabs (Lisinopril-hydrochlorothiazide) .... Two tablets daily for blood pressure control and kidney protection 5)    Pravachol 40 Mg Tabs (Pravastatin sodium) .... Take two tablets daily 6)    Synthroid 125 Mcg  Tabs (Levothyroxine sodium) .... Take 1 tablet daily  Sincerely,    [facility] Delbert Harness MD  Appended Document: Lipid Letter mailed

## 2010-04-11 NOTE — Assessment & Plan Note (Signed)
Summary: Diabetes F/U   Vital Signs:  Patient profile:   50 year old female Height:      64 inches Weight:      262 pounds BMI:     45.13 Temp:     97.9 degrees F oral Pulse rate:   69 / minute BP sitting:   135 / 85  (left arm) Cuff size:   regular  Vitals Entered By: Jimmy Footman, CMA (September 29, 2009 4:12 PM) CC: diabetes f/u Is Patient Diabetic? Yes   Primary Care Provider:  Delbert Harness MD  CC:  diabetes f/u.  History of Present Illness: 50 yo here for follow-up of DM  DIABETES Meds: Metformin 1000mg  BID Taking and tolerating? yes Blood sugars: fasting 115-120 Hypoglycemic symptoms: no Visual problems: no Monitoring feet: no Numbness/Tingling: no Last eye exam: June 2011 Last A1c:7.0 Flu/PNA vaccines:  pneumovax 2009  HYPERLIPIDEMIA Meds: Pravachol 40 Taking and tolerating? yes Diet pattern:being mindful Exercise: walking dog 3-4 days a wek about 30 minutes, gardening  HYPERTENSION Meds: Taking and tolerating? 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 Chest Pain: no Dyspnea: no   obesity: has recently started walking.  Has lost 10 pounds since last visit.  Habits & Providers  Alcohol-Tobacco-Diet     Tobacco Status: never  Current Medications (verified): 1)  Freestyle Lite   Strp (Glucose Blood) .... Check Sugars Twice A Day, Once Before Breakfast, Once Before Bed 2)  Ultra Thin Lancets   Misc (Lancets) .... Use With To Check Sugars 3)  Metformin Hcl 1000 Mg Tabs (Metformin Hcl) .... Take One Tablet Twice A Day 4)  Lisinopril-Hydrochlorothiazide 20-12.5 Mg  Tabs (Lisinopril-Hydrochlorothiazide) .... Two Tablets Daily For Blood Pressure Control and Kidney Protection 5)  Pravachol 40 Mg Tabs (Pravastatin Sodium) .... Take Two Tablets Daily 6)  Synthroid 125 Mcg  Tabs (Levothyroxine Sodium) .... Take 1 Tablet Daily  Allergies: 1)  ! Percocet (Oxycodone-Acetaminophen) PMH-FH-SH reviewed-no changes except otherwise  noted  Social History: No tob, occ etoh.  Lives alone.  Has boyfriend for many yrs.; Works at [employer].   At [employer] for nursing school.  Now cares for deceased sister's (died unexpectedly in 07-30-22) almost two years old in Dec 09. Laqueta Due?)  Graduated from nursing school.  Looking for a job. 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    Review of Systems      See HPI  Physical Exam  General:  Well-developed,well-nourished,in no acute distress; alert,appropriate and cooperative throughout examination.  Obese.  vtials reviewed Lungs:  Normal respiratory effort, chest expands symmetrically. Lungs are clear to auscultation, no crackles or wheezes. Heart:  Normal rate and regular rhythm. S1 and S2 normal without gallop, murmur, click, rub or other extra sounds. Extremities:  No edema   Impression & Recommendations:  Problem # 1:  DM, UNCOMPLICATED, TYPE II (ICD-250.00) Improved A1c.  SUpported her weight loss and succes with lowering A1C.  F/u in 3 months  Her updated medication list for this problem includes:    Metformin Hcl 1000 Mg Tabs (Metformin hcl) .Marland Kitchen... Take one tablet twice a day    Lisinopril-hydrochlorothiazide 20-12.5 Mg Tabs (Lisinopril-hydrochlorothiazide) .Marland Kitchen..Marland Kitchen Two tablets daily for blood pressure control and kidney protection  Orders: A1C-[facility] ([address]) [facility]- Est  Level 4 ([address])Future Orders: Comp Met-[facility] ([id number]) ... 09/22/2010  Labs Reviewed: Creat: 0.79 (07/21/2008)   Microalbumin: trace (11/30/2008)  Last Eye Exam: normal (08/19/2008) Reviewed HgBA1c results: 6.1 (09/29/2009)  7.0 (04/22/2009)  Problem # 2:  HYPERTENSION, BENIGN SYSTEMIC (ICD-401.1)  slightly above goal but improved.  Patient re-educated on BP goal- she did not remember.  Given instructions for BP log- to come back sooner if elevated >130/80.      Her updated medication list for this problem includes:    Lisinopril-hydrochlorothiazide 20-12.5 Mg Tabs (Lisinopril-hydrochlorothiazide) .Marland Kitchen..Marland Kitchen Two tablets daily  for blood pressure control and kidney protection  BP today: 135/85 Prior BP: 145/89 (04/22/2009)  Prior 10 Yr Risk Heart Disease: 11 % (12/16/2006)  Labs Reviewed: K+: 4.4 (07/21/2008) Creat: : 0.79 (07/21/2008)   Chol: 183 (11/19/2006)   HDL: 38 (11/19/2006)   LDL: 126 (11/19/2006)   TG: 96 (11/19/2006)  Orders: [facility]- Est  Level 4 ([address])  Problem # 3:  MORBID OBESITY (ICD-278.01)  Improved.  Encouraged continued weight loss  Ht: 64 (09/29/2009)   Wt: 262 (09/29/2009)   BMI: 45.13 (09/29/2009)  Orders: [facility]- Est  Level 4 (09811)  Problem # 4:  HYPERLIPIDEMIA (ICD-272.4)  will rechekc fasting labs  Her updated medication list for this problem includes:    Pravachol 40 Mg Tabs (Pravastatin sodium) .Marland Kitchen... Take two tablets daily  Orders: [facility]- Est  Level 4 ([address])Future Orders: Lipid-[facility] ([id number]) ... 09/23/2010  Complete Medication List: 1)  Freestyle Lite Strp (Glucose blood) .... Check sugars twice a day, once before breakfast, once before bed 2)  Ultra Thin Lancets Misc (Lancets) .... Use with to check sugars 3)  Metformin Hcl 1000 Mg Tabs (Metformin hcl) .... Take one tablet twice a day 4)  Lisinopril-hydrochlorothiazide 20-12.5 Mg Tabs (Lisinopril-hydrochlorothiazide) .... Two tablets daily for blood pressure control and kidney protection 5)  Pravachol 40 Mg Tabs (Pravastatin sodium) .... Take two tablets daily 6)  Synthroid 125 Mcg Tabs (Levothyroxine sodium) .... Take 1 tablet daily  Other Orders: Future Orders: TSH-[facility] ([id number]) ... 09/23/2010  Patient Instructions: 1)  make appt for fasting blood work 2)  [place] job on the weight loss and extra exercise! 3)  keep BP log, let me know ifyou notice numbers > 130/80.   4)  Bring log to next appt 5)  follow-up in 3 months or sooner if needed. Prescriptions: LISINOPRIL-HYDROCHLOROTHIAZIDE 20-12.5 MG  TABS (LISINOPRIL-HYDROCHLOROTHIAZIDE) Two tablets daily for blood pressure control and kidney protection  #180  x 1   Entered and Authorized by:   Delbert Harness MD   Signed by:   Delbert Harness MD on 09/29/2009   Method used:   Electronically to        [facility]  [address] [id number] ([facility])       [address]       [place], [place]  [address]       Ph: [phone] or [phone]       Fax: [phone]   RxID:   [id number] SYNTHROID 125 MCG  TABS (LEVOTHYROXINE SODIUM) Take 1 tablet daily  #30 x 0   Entered and Authorized by:   Delbert Harness MD   Signed by:   Delbert Harness MD on 09/29/2009   Method used:   [facility] to        [facility]  [address] [id number]       [address]       [place], [place]  [address]       Ph: [phone] or [phone]       Fax: [phone]   RxID:   [id number]   Laboratory Results   Blood Tests  Date/Time Received: September 29, 2009 4:12 PM  Date/Time Reported: September 29, 2009 4:23 PM   HGBA1C: 6.1%   (Normal Range: Non-Diabetic - 3-6%   Control Diabetic - 6-8%)  Comments: ...............test performed by......Marland KitchenBonnie A. [place]azil[place], MLS (ASCP)cm       Prevention & Chronic Care Immunizations   Influenza vaccine: Fluvax Non-MCR  (01/09/2008)   Influenza vaccine due: 12/16/2007    Tetanus booster: 07/10/2005: Done.   Tetanus booster due: 07/11/2015    Pneumococcal vaccine: Pneumovax  (01/09/2008)   Pneumococcal vaccine due: None  Other Screening   Pap smear: NEGATIVE FOR INTRAEPITHELIAL LESIONS OR MALIGNANCY.  (08/05/2008)   Pap smear due: 01/11/2007    Mammogram: Normal  (03/21/2007)   Mammogram due: 03/20/2008   Smoking status: never  (09/29/2009)  Diabetes Mellitus   HgbA1C: 6.1  (09/29/2009)   HgbA1C action/deferral: Ordered  (11/30/2008)   Hemoglobin A1C due: 04/03/2008    Eye exam: normal  (08/19/2008)   Eye exam due: 08/19/2009    Foot exam: yes  (07/15/2008)   Foot exam action/deferral: Do today   High risk foot: Not documented   Foot care education: Not documented   Foot exam due:  07/22/2008    Urine microalbumin/creatinine ratio: Not documented   Urine microalbumin action/deferral: Ordered   Urine microalbumin/cr due: 02/21/2008    Diabetes flowsheet reviewed?: Yes   Progress toward A1C goal: At goal  Lipids   Total Cholesterol: 183  (11/19/2006)   LDL: 126  (11/19/2006)   LDL Direct: 99  (07/21/2008)   HDL: 38  (11/19/2006)   Triglycerides: 96  (11/19/2006)    SGOT (AST): 24  (07/21/2008)   SGPT (ALT): 37  (07/21/2008) CMP ordered    Alkaline phosphatase: 49  (07/21/2008)   Total bilirubin: 0.3  (07/21/2008)    Lipid flowsheet reviewed?: Yes   Progress toward LDL goal: At goal  Hypertension   Last Blood Pressure: 135 / 85  (09/29/2009)   Serum creatinine: 0.79  (07/21/2008)   Serum potassium 4.4  (07/21/2008) CMP ordered     Hypertension flowsheet reviewed?: Yes   Progress toward BP goal: Improved  Self-Management Support :   Personal Goals (by the next clinic visit) :     Personal A1C goal: 7  (11/30/2008)     Personal blood pressure goal: 130/80  (11/30/2008)     Personal LDL goal: 100  (11/30/2008)    Patient will work on the following items until the next clinic visit to reach self-care goals:     Medications and monitoring: take my medicines every day, check my blood pressure, weigh myself weekly  (09/29/2009)     Eating: drink diet soda or water instead of juice or soda, eat more vegetables, use fresh or frozen vegetables, eat foods that are low in salt  (09/29/2009)     Activity: take a 30 minute walk every day, take the stairs instead of the elevator, park at the far end of the parking lot  (04/22/2009)    Diabetes self-management support: Education handout, Written self-care plan  (04/22/2009)    Hypertension self-management support: BP self-monitoring log, Written self-care plan  (09/29/2009)   Hypertension self-care plan printed.    Lipid self-management support: Written self-care plan  (04/22/2009)

## 2010-04-13 NOTE — Assessment & Plan Note (Signed)
Summary: rash,df   Vital Signs:  Patient profile:   50 year old female Height:      64 inches Weight:      269 pounds Temp:     98.6 degrees F oral Pulse rate:   80 / minute Pulse rhythm:   regular BP sitting:   148 / 83  (left arm) Cuff size:   regular  Vitals Entered By: Loralee Pacas CMA (March 30, 2010 4:41 PM) CC: rash Is Patient Diabetic? Yes Comments rash on legs x 3 weeks no changes in soaps, detergent, diet. pt has dog that stays outdoors   Primary Care Provider:  Delbert Harness MD  CC:  rash.  History of Present Illness: 50 yo with 3 weeks history of rash on legs.    rash:  no new soaps, detergents, outdoor exposure.  no other household contacts effected.  4 spots- 2 on each leg, pruritic, becomes "pustule" then gets infected and heals, in different staes.  Has been usuing hydrocortisone and neosporin without improvement.  Patient states she has not beend oing as well with DM and HTN.  She has not been checking CBG's or Blood pressure regulalry.  States she is taking medications.  Habits & Providers  Alcohol-Tobacco-Diet     Alcohol drinks/day: 0     Tobacco Status: never  Exercise-Depression-Behavior     Have you felt down or hopeless? no     Have you felt little pleasure in things? no     Depression Counseling: not indicated; screening negative for depression     Seat Belt Use: always  Current Medications (verified): 1)  Freestyle Lite   Strp (Glucose Blood) .... Check Sugars Twice A Day, Once Before Breakfast, Once Before Bed 2)  Ultra Thin Lancets   Misc (Lancets) .... Use With To Check Sugars 3)  Metformin Hcl 1000 Mg Tabs (Metformin Hcl) .... Take One Tablet Twice A Day 4)  Lisinopril-Hydrochlorothiazide 20-12.5 Mg  Tabs (Lisinopril-Hydrochlorothiazide) .... Two Tablets Daily For Blood Pressure Control and Kidney Protection 5)  Pravachol 40 Mg Tabs (Pravastatin Sodium) .... Take Two Tablets Daily 6)  Synthroid 125 Mcg  Tabs (Levothyroxine Sodium) ....  Take 1 Tablet Daily 7)  Doxycycline Hyclate 100 Mg Caps (Doxycycline Hyclate) .... One Tab Twice A Day For 1 Week  Allergies: 1)  ! Percocet (Oxycodone-Acetaminophen) PMH-FH-SH reviewed for relevance  Social History: Risk analyst Use:  always  Review of Systems      See HPI  Physical Exam  General:  Well-developed,well-nourished,in no acute distress; alert,appropriate and cooperative throughout examination.  Obese.  vtials reviewed Skin:  4 papules noted to be consistant with folliculitis.  One .8cm area above right knee with scarring appears to be healing infection.   Impression & Recommendations:  Problem # 1:  FOLLICULITIS (ICD-704.8)  prescribed course of docycycline and gave instructions on clorhexidine wash.  Asked patient to return if no improvement.  Orders: [facility]- Est  Level 4 ([address]109)  Problem # 2:  HYPERTENSION, BENIGN SYSTEMIC (ICD-401.1)  elevated today.  Discussed with patient need for improvement.  She will check BP's at home and bring log.  Will schedule appt. Her updated medication list for this problem includes:    Lisinopril-hydrochlorothiazide 20-12.5 Mg Tabs (Lisinopril-hydrochlorothiazide) .Marland Kitchen..Marland Kitchen Two tablets daily for blood pressure control and kidney protection  Orders: [facility]- Est  Level 4 (60454)  Problem # 3:  DM, UNCOMPLICATED, TYPE II (ICD-250.00) A!c elevated, continues ot be at goal.  Will discuss dietary changes further at next appointment.  no change in medications today.  She plans on increasing daily exercise.  Her updated medication list for this problem includes:    Metformin Hcl 1000 Mg Tabs (Metformin hcl) .Marland Kitchen... Take one tablet twice a day    Lisinopril-hydrochlorothiazide 20-12.5 Mg Tabs (Lisinopril-hydrochlorothiazide) .Marland Kitchen..Marland Kitchen Two tablets daily for blood pressure control and kidney protection  Orders: A1C-[facility] (04540) [facility]- Est  Level 4 (98119)  Labs Reviewed: Creat: 0.88 (10/12/2009)   Microalbumin: trace (11/30/2008)  Last Eye Exam:  normal (08/19/2008) Reviewed HgBA1c results: 6.6 (03/30/2010)  6.1 (09/29/2009)  Complete Medication List: 1)  Freestyle Lite Strp (Glucose blood) .... Check sugars twice a day, once before breakfast, once before bed 2)  Ultra Thin Lancets Misc (Lancets) .... Use with to check sugars 3)  Metformin Hcl 1000 Mg Tabs (Metformin hcl) .... Take one tablet twice a day 4)  Lisinopril-hydrochlorothiazide 20-12.5 Mg Tabs (Lisinopril-hydrochlorothiazide) .... Two tablets daily for blood pressure control and kidney protection 5)  Pravachol 40 Mg Tabs (Pravastatin sodium) .... Take two tablets daily 6)  Synthroid 125 Mcg Tabs (Levothyroxine sodium) .... Take 1 tablet daily 7)  Doxycycline Hyclate 100 Mg Caps (Doxycycline hyclate) .... One tab twice a day for 1 week  Patient Instructions: 1)  You have folliculitis, or an infection of your hair follicles.  Usse antibiotic doxycylcin twice a day for 1 week and buy hibiclens available over the counter as an anti-staph bacterial soap. 2)  Return if does not resolve 3)  Make follow-up apopintment to discuss diabetes, high blood pressure 4)  Nice to see you! Prescriptions: PRAVACHOL 40 MG TABS (PRAVASTATIN SODIUM) Take two tablets daily  #60 x 5   Entered and Authorized by:   Delbert Harness MD   Signed by:   Delbert Harness MD on 03/30/2010   Method used:   Electronically to        [facility] (retail)       [address]       [place], [place]  [address]       Ph: [phone]       Fax: [phone]   RxID:   [id number] SYNTHROID 125 MCG  TABS (LEVOTHYROXINE SODIUM) Take 1 tablet daily  #30 x 5   Entered and Authorized by:   Delbert Harness MD   Signed by:   Delbert Harness MD on 03/30/2010   Method used:   Electronically to        [facility] (retail)       [address]       [place], [place]  [address]       Ph: [phone]       Fax: [phone]   RxID:    [id number] LISINOPRIL-HYDROCHLOROTHIAZIDE 20-12.5 MG  TABS (LISINOPRIL-HYDROCHLOROTHIAZIDE) Two tablets daily for blood pressure control and kidney protection  #180 x 2   Entered and Authorized by:   Delbert Harness MD   Signed by:   Delbert Harness MD on 03/30/2010   Method used:   Electronically to        [facility] (retail)       [address]       [place], [place]  [address]       Ph: [phone]       Fax: [phone]   RxID:   [id number] METFORMIN HCL 1000 MG TABS (  METFORMIN HCL) take one tablet twice a day  #60 x 5   Entered and Authorized by:   Delbert Harness MD   Signed by:   Delbert Harness MD on 03/30/2010   Method used:   Electronically to        [facility] (retail)       [address]       [place], [place]  [address]       Ph: [phone]       Fax: [phone]   RxID:   [id number] DOXYCYCLINE HYCLATE 100 MG CAPS (DOXYCYCLINE HYCLATE) one tab twice a day for 1 week  #14 x 0   Entered and Authorized by:   Delbert Harness MD   Signed by:   Delbert Harness MD on 03/30/2010   Method used:   Electronically to        [facility] (retail)       [address]       [place], [place]  [address]       Ph: [phone]       Fax: [phone]   RxID:   [id number]    Orders Added: 1)  A1C-[facility] [83036] 2)  [facility]- Est  Level 4 [[address]]     Prevention & Chronic Care Immunizations   Influenza vaccine: Fluvax Non-MCR  (01/09/2008)   Influenza vaccine due: 12/16/2007    Tetanus booster: 07/10/2005: Done.   Tetanus booster due: 07/11/2015    Pneumococcal vaccine: Pneumovax  (01/09/2008)   Pneumococcal vaccine due: None  Other Screening   Pap smear: NEGATIVE FOR INTRAEPITHELIAL LESIONS OR MALIGNANCY.  (08/05/2008)   Pap smear due: 01/11/2007    Mammogram: Normal  (03/21/2007)   Mammogram due: 03/20/2008   Smoking status: never  (03/30/2010)  Diabetes Mellitus   HgbA1C:  6.6  (03/30/2010)   HgbA1C action/deferral: Ordered  (11/30/2008)   Hemoglobin A1C due: 04/03/2008    Eye exam: normal  (08/19/2008)   Eye exam due: 08/19/2009    Foot exam: yes  (07/15/2008)   Foot exam action/deferral: Do today   High risk foot: Not documented   Foot care education: Not documented   Foot exam due: 07/22/2008    Urine microalbumin/creatinine ratio: Not documented   Urine microalbumin action/deferral: Ordered   Urine microalbumin/cr due: 02/21/2008  Lipids   Total Cholesterol: 131  (10/12/2009)   LDL: 84  (10/12/2009)   LDL Direct: 99  (07/21/2008)   HDL: 33  (10/12/2009)   Triglycerides: 70  (10/12/2009)    SGOT (AST): 21  (10/12/2009)   SGPT (ALT): 33  (10/12/2009)   Alkaline phosphatase: 38  (10/12/2009)   Total bilirubin: 0.4  (10/12/2009)  Hypertension   Last Blood Pressure: 148 / 83  (03/30/2010)   Serum creatinine: 0.88  (10/12/2009)   Serum potassium 4.1  (10/12/2009)  Self-Management Support :   Personal Goals (by the next clinic visit) :     Personal A1C goal: 7  (11/30/2008)     Personal blood pressure goal: 130/80  (11/30/2008)     Personal LDL goal: 100  (11/30/2008)    Diabetes self-management support: Education handout, Written self-care plan  (04/22/2009)    Hypertension self-management support: BP self-monitoring log, Written self-care plan  (09/29/2009)    Lipid self-management support: Written self-care plan  (04/22/2009)   Laboratory Results   Blood Tests   Date/Time Received: March 30, 2010 4:52 PM  Date/Time Reported: March 30, 2010 5:10 PM   HGBA1C: 6.6%   (Normal Range: Non-Diabetic - 3-6%   Control Diabetic - 6-8%)  Comments: ...............test performed by......Marland KitchenBonnie A. [place], MLS (ASCP)cm

## 2010-04-21 ENCOUNTER — Ambulatory Visit: Payer: Self-pay | Admitting: Family Medicine

## 2010-07-04 ENCOUNTER — Telehealth: Payer: Self-pay | Admitting: *Deleted

## 2010-07-04 NOTE — Telephone Encounter (Signed)
Thanks for calling this in.

## 2010-07-04 NOTE — Telephone Encounter (Signed)
recieved message left on Physicians [facility] Pharmacy voicemail from  Amy Lequita Halt of Diabetes Education at [facility]. She states patient needs RX for [employer] strips called to Outpatient pharmacy at [place] at [phone]. Will forward message to MD.

## 2010-07-05 MED ORDER — GLUCOSE BLOOD VI STRP: ORAL_STRIP | Status: DC

## 2010-07-05 NOTE — Telephone Encounter (Signed)
rx called to pharmacy . Dr. Earnest Bailey states to check BS once daily fasting, then PRN.

## 2010-07-28 NOTE — Op Note (Signed)
NAMEAUDIE, Christie Estrada               ACCOUNT NO.:  [id number]   MEDICAL RECORD NO.:  [id number]          PATIENT TYPE:  AMB   LOCATION:  ENDO                         FACILITY:  [facility]   PHYSICIAN:  Anselmo Rod, M.D.  DATE OF BIRTH:  05/06/1960   DATE OF PROCEDURE:  09/17/2005  DATE OF DISCHARGE:                                 OPERATIVE REPORT   PROCEDURE PERFORMED:  Colonoscopy with multiple cold biopsies.   ENDOSCOPIST:  Anselmo Rod, M.D.   INSTRUMENT USED:  Olympus video colonoscope.   INDICATION FOR PROCEDURE:  [age]-year-old African American female with  history of rectal bleeding, undergoing a screening colonoscopy to rule out  colon polyps, masses, etc.   PREPROCEDURE PREPARATION:  Informed consent was procured from the patient.  The patient fasted for 4 hours prior to the procedure and prepped with  Osmoprep pill the night and on the morning of the procedure.  Risks and  benefits of the procedure including a 10% miss rate of cancer in polyps were  discussed with the patient as well.   PREPROCEDURE PHYSICAL:  VITAL SIGNS:  The patient had stable vital signs.  NECK:  Supple.  CHEST:  Clear to auscultation.  CARDIAC:  S1 and S2 regular.  ABDOMEN:  Soft with normal bowel sounds.   DESCRIPTION OF THE PROCEDURE:  The patient was placed in the left lateral  decubitus position and sedated with 100 mcg of fentanyl and 8 mg of Versed  in slow incremental doses.  Once the patient was adequately sedated and  maintained on low-flow oxygen and continuous cardiac monitoring, the Olympus  video colonoscope was advanced from the rectum to the cecum.  The  appendiceal orifice and ileocecal valve were clearly visualized and  photographed.  The entire colonic mucosa appeared healthy, except for some  sigmoid diverticula.  Patchy erythema was noted in the rectum of unclear  significance; biopsies were done to rule out proctitis.  Small internal  hemorrhoids were appreciated.   The patient tolerated the procedure well  without complications.   IMPRESSION:  1.Small internal hemorrhoids.  2.Patchy erythema biopsied from the rectum.  3.Rule out proctitis.  4.A few early sigmoid diverticula.  5.Otherwise normal-appearing transverse colon, right colon, cecum and  terminal ileum.   RECOMMENDATIONS:  1.Await pathology results.  2.Avoid all nonsteroidals including aspirin for the next 2 weeks.  3.Continue a high-fiber diet with liberal fluid intake.  4.Outpatient followup in the next 2 weeks for further recommendations.      Anselmo Rod, M.D.  Electronically Signed     JNM/MEDQ  D:  09/17/2005  T:  09/18/2005  Job:  [id number]   cc:   Hansel Feinstein, M.D.  [address]  [place], [place] [address]

## 2010-07-28 NOTE — Procedures (Signed)
[place]. [facility]  Patient:    Christie Estrada, Christie Estrada                      MRN: [id number] Proc. Date: 06/19/00 Adm. Date:  [id number] Attending:  Charna Elizabeth CC:         Sheppard Plumber. Earlene Plater, M.D., [facility]   Procedure Report  DATE OF BIRTH:  04-Jul-1960.  PROCEDURE PERFORMED:  Colonoscopy.  ENDOSCOPIST:  Anselmo Rod, M.D.  INSTRUMENT USED:  Olympus video colonoscope.  INDICATION FOR PROCEDURE:  Rectal bleeding in a 50 year old African-American female that has been ongoing for the last eight months; rule out colonic polyps, masses, hemorrhoids, colitis, etc.  PRE-PROCEDURE PREPARATION:  Informed consent was procured from the patient. The patient was fasted for eight hours prior to the procedure and prepped with a bottle of magnesium citrate and a gallon of NuLytely the night prior to the procedure.  PRE-PROCEDURE PHYSICAL:  VITAL SIGNS:  Patient had stable vital signs.  NECK:  Neck supple.  CHEST:  Chest clear to auscultation.  S1 and S2 regular.  ABDOMEN:  Abdomen soft with normal abdominal bowel sounds.  DESCRIPTION OF THE PROCEDURE:  The patient was placed in the left lateral decubitus position and sedated with Demerol 100 mg and Versed 10 mg intravenously in slow incremental doses.  Once the patient was adequately sedate and maintained on low-flow oxygen and continuous cardiac monitoring, the Olympus video colonoscope was advanced from the rectum to the cecum with slight difficulty secondary to a large amount of residual stool in the colon. No masses, polyps, erosions, ulcerations or diverticula were seen.  The patient had small internal hemorrhoids seen on retroflexion in the rectum and tolerated the procedure well without complication.  There was significant amount of stool in the cecum and the right colon.  Multiple washes were done.  IMPRESSION: 1. Small non-bleeding internal hemorrhoids. 2. No masses or polyps  seen.  RECOMMENDATION: 1. The patient is being started on Anusol suppositories 2.5%, 1 PR q.h.s., #30    have been prescribed. 2. A high-fiber diet with liberal fluid intake has been advocated. 3. Outpatient followup is advised within the next two weeks. 4. If her rectal bleeding does not improve with the above interventions, a    small bowel study and a possible EGD will be required. DD:  06/19/00 TD:  06/20/00 Job: [id number]

## 2010-07-28 NOTE — H&P (Signed)
NAME:  Christie, Estrada                         ACCOUNT NO.:  [id number]   MEDICAL RECORD NO.:  [id number]                   PATIENT TYPE:  AMB   LOCATION:  SDC                                  FACILITY:  [facility]   PHYSICIAN:  Tracie Harrier, M.D.              DATE OF BIRTH:  08/31/1960   DATE OF ADMISSION:  DATE OF DISCHARGE:                                HISTORY & PHYSICAL   DATE OF SURGERY:  April 23, 2002.   HISTORY OF PRESENT ILLNESS:  The patient is a 50 year old female gravida 1,  para 1 admitted for laparoscopic removal of a right hydrosalpinx.  The  patient underwent a CT scan for abdominal and pelvic pain in late 2003  whereby a right pelvic mass was noted.  This was followed up with an  ultrasound which showed a structure consistent with a right hydrosalpinx.  Initially, conservative management was undertaken for this.  However, the  patient had developed increasing pain and is now admitted to undergo  surgical excision of this by laparoscopy.  I also offered the patient tubal  ligation of the left tube and she requested permanent tubal sterilization.   The patient does have a history of obesity and chronic hypertension.  She is  on two medications for this.   PAST MEDICAL HISTORY:  1. History of obesity.  2. History of chronic hypertension.  3. History of arthritis.   PAST SURGICAL HISTORY:  1. Rotator cuff repair on right shoulder.  2. Torn ligaments repair right shoulder 1995.   OBSTETRICAL HISTORY:  Normal spontaneous vaginal delivery x1 at term.   CURRENT MEDICATIONS:  1. Topamax.  2. Hydrochlorothiazide.  3. Atenolol.   ALLERGIES:  PERCOCET.   SOCIAL HISTORY:  Negative for smoking, illicit drug usage, or alcohol  intake.   PHYSICAL EXAMINATION:  VITAL SIGNS:  Stable, blood pressure 140/90, weight  267 pounds.  GENERAL:  She is a well-developed, well-nourished but overweight female in  no acute distress.  HEENT:  Within normal limits.  NECK:   Supple without adenopathy or thyromegaly.  HEART:  Regular rate and rhythm without murmur, gallop, or rub.  LUNGS:  Clear to auscultation.  BREASTS:  Deferred.  ABDOMEN:  Overweight without masses, tenderness, hernia, or organomegaly.  There is some mild tenderness in the right lower quadrant without peritoneal  signs.  EXTREMITIES AND NEUROLOGICALLY:  Grossly normal.  PELVIC:  Normal external female genitalia, vagina and cervix clear.  No real  masses appreciated on bimanual exam.  The uterus appears small, nontender,  and mobile.  The right adnexa is mildly tender, the left adnexa is clear.   ADMISSION DIAGNOSES:  1. Right pelvic mass consistent with right hydrosalpinx.  2. Voluntary sterilization.   PLAN:  Laparoscopy with right salpingectomy and left tubal ligation.   DISCUSSION:  The risks and benefits of this surgery explained to the  patient.  The risk of bleeding,  infection, risk of injury to surrounding  organs were reviewed.  Also tubal failure rate of 1 in 200 was reviewed.  I  also discussed with the patient that if this was unable to be performed  safely with laparoscopy, we will do this procedure by laparotomy.  Questions  were answered regarding the procedure.                                               Tracie Harrier, M.D.    REG/MEDQ  D:  04/22/2002  T:  04/22/2002  Job:  [id number]

## 2010-07-28 NOTE — Op Note (Signed)
NAME:  Christie Estrada, Christie Estrada                         ACCOUNT NO.:  [id number]   MEDICAL RECORD NO.:  [id number]                   PATIENT TYPE:  AMB   LOCATION:  SDC                                  FACILITY:  [facility]   PHYSICIAN:  Tracie Harrier, M.D.              DATE OF BIRTH:  [birth year]   DATE OF PROCEDURE:  04/23/2002  DATE OF DISCHARGE:                                 OPERATIVE REPORT   PREOPERATIVE DIAGNOSES:  1. Right hydrosalpinx.  2. Voluntary sterilization.   POSTOPERATIVE DIAGNOSES:  Bilateral hydrosalpinx.   PROCEDURE:  1. Laparoscopy.  2. Bilateral salpingectomy.   SURGEON:  Tracie Harrier, M.D.   ANESTHESIA:  General.   ESTIMATED BLOOD LOSS:  20 mL.   COMPLICATIONS:  None.   FINDINGS:  At time of laparoscopy the pelvis was thoroughly and  systematically visualized.  Pelvic adhesions were noted in the posterior  aspect of the pelvis.  The uterus was of normal size and shape.  The right  and left tubes both were consistent with hydrosalpinx.  The ovaries were  visualized and noted to be normal but the left ovary was adherent to the  left pelvic side wall.   The appendix was not visualized.  The bowel was visualized briefly and noted  to be normal.   PROCEDURE:  The patient was taken to the operating room where a general  endotracheal anesthetic was administered.  The patient was placed on the  operating table in the dorsal lithotomy position.  The abdomen, perineum,  and vagina were prepped and draped in the usual sterile fashion with  Betadine and sterile drapes.  A red rubber catheter was used to empty the  bladder.  A Hulka tenaculum was placed in the intrauterine cavity for  uterine manipulation.  Next, the abdomen was entered through an open  laparoscopic technique through a small vertical infraumbilical skin incision  and carried down sharply in the usual fashion.  The peritoneum was  atraumatically entered.  A purse-string suture was placed around the  defect  in the muscle fascia and a Hissan cannula was then placed for open  laparoscopy.  Carbon dioxide gas was then used as the visualizing medium.  The laparoscope was inserted with the above noted findings.  A secondary  incision was made two fingerbreadths above the pubis symphysis and in the  midline.  Through this a 5 mm trocar was placed under direct, continuous  laparoscopic guidance.  The laparoscope was then inserted with the above  noted findings.  Bilateral salpingectomy was then undertaken.  First, the  left tube was grasped with a grasper, elevated into the anterior portion of  the abdominal wall.  The mesosalpinx was then cauterized with the bipolar  cautery and the tube was excised sharply where the mesosalpinx was  cauterized.  The same procedure was repeated on the left tube.  Initially, I  was only going  to ligate the left tube, but again, hydrosalpinx was noted  and I removed this.  The tubes were then removed through the umbilical  trocar without difficulty.  Pelvis was thoroughly irrigated and hemostasis  noted.  As mentioned, the ovaries were normal but the left ovary was  adherent to the left pelvic side wall.  I did not remove these adhesions.   The pelvis was then thoroughly again irrigated and hemostasis noted.  Attention was then turned to closure.  The laparoscope and all abdominal  instruments were removed.  The gas was allowed to completely escape from the  abdomen.  The infraumbilical incision was closed by tying the purse-string  suture securely approximating the muscle fascia.  The subcutaneous tissue  was reapproximated using multiple interrupted sutures of 3-0 Vicryl Rapide.  The skin reapproximated with Dermabond on both skin incisions.  All vaginal  instruments were removed.  The patient was awakened, extubated, and taken to  recovery room in good condition.  There were no perioperative complications.                                                Tracie Harrier, M.D.    REG/MEDQ  D:  04/23/2002  T:  04/23/2002  Job:  [id number]

## 2010-07-28 NOTE — Procedures (Signed)
[place]. [facility]  Patient:    Christie Estrada, Christie Estrada Visit Number: [id number] MRN: [id number]          Service Type: END Location: ENDO Attending Physician:  Charna Elizabeth Dictated by:   Anselmo Rod, M.D. Proc. Date: 10/03/01 Admit Date:  10/03/2001   CC:         Timothy E. Earlene Plater, M.D.   Procedure Report  DATE OF BIRTH:  [birth year]  PROCEDURE PERFORMED:  Flexible sigmoidoscopy with biopsies.  ENDOSCOPIST:  Anselmo Rod, M.D.  INSTRUMENT USED:  Olympus video colonoscope.  INDICATIONS FOR PROCEDURE:  A 50 year old African-American female with a history of rectal bleeding.  The patient had a colonoscopy done last year that did not reveal any immediate source of blood loss.  PREPROCEDURE PREPARATION:  Informed consent was procured from the patient. The patient had fasted for eight hours prior to procedure and was prepped with two Fleets enemas the morning of the procedure.  PREPROCEDURE PHYSICAL:  VITAL SIGNS:  The patient had stable vital signs.  NECK:  Supple.  CHEST:  Clear to auscultation.  ABDOMEN:  Obese with normal bowel sounds.  DESCRIPTION OF THE PROCEDURE:  The patient was placed in the left lateral decubitus position and sedated with 100 mg of Demerol and 10 mg of Versed intravenously.  Once the patient was adequately sedated, maintained on low-flow oxygen and continuous cardiac monitoring, the Olympus video colonoscope was advanced from the rectum to 70 cm without difficulty.  The patient had an excellent prep.  There was only left-sided diverticulosis noted with small nonbleeding internal hemorrhoids.  There was patchy petechiae seen in the rectum.  This was biopsied to rule out proctitis.  A small blood clot was seen at about 40 cm, but the exact source of this blood could not be located.  IMPRESSION: 1. Early left-sided diverticulosis. 2. Small internal hemorrhoid. 3. Biopsy of petechiae in rectum.  Biopsy to rule  out proctitis.  RECOMMENDATIONS: 1. Await pathology results. 2. Anusol-HC 2.5% suppositories one p.r. q.h.s. #30 with one refill had been    prescribed. 3. A high fiber diet with liberal fluid intake. 4. Outpatient follow-up in the next two weeks. Dictated by:   Anselmo Rod, M.D. Attending Physician:  Charna Elizabeth DD:  10/03/01 TD:  10/07/01 Job: [id number]

## 2010-07-28 NOTE — Op Note (Signed)
NAMESHARAE, ZAPPULLA               ACCOUNT NO.:  [id number]   MEDICAL RECORD NO.:  [id number]          PATIENT TYPE:  OIB   LOCATION:  1605                         FACILITY:  [facility]   PHYSICIAN:  Anselm Pancoast. [place], M.D.DATE OF BIRTH:  30-Dec-1960   DATE OF PROCEDURE:  02/20/2006  DATE OF DISCHARGE:                               OPERATIVE REPORT   PREOPERATIVE DIAGNOSIS:  Large goiter, removed.   OPERATIONS:  1. Right total __________ lobectomy.   ANESTHESIA:  General.   HISTORY:  Ms. Christie Estrada is a 50 year old female who was referred to me by  Dr. Shane Crutch  for a symptomatic goiter.  The patient states that she works  for [employer]; had a problem of obvious goiter; and so Dr. Lucianne Muss,  about 5 years ago, I think, she had a thyroid biopsy; and it was benign;  and she was not placed on thyroid suppression.  A goiter gradually  became more enlarged.  She had an MRI for a shoulder problem by an  orthopedic doctor; and it showed a pretty prominent enlarged goiter and  she was referred to Dr. Shane Crutch.  He thought it was most likely benign and  placed her on Synthroid.  The gland continued to cause, kind of a  choking sensation, etcetera;  and a repeat ultrasound was performed; and  it showed that the right lobe was 9.2 x 5 cm and the left lobe is only  about 1.5 cm in thickness x 5 cm in length.  I saw her; and suggested  that I thought most likely this was benign.   She weighs about 250 pounds; and she has a history of high blood  pressure; and is on medication for that.  She has been on Synthroid 112  mcg a day, not really had any decrease in the size of the goiter and  this kind of swallowing symptoms, etcetera;  and she has elected to  proceed on with a right thyroid lobectomy.  We did not repeat the  biopsy, but recommended that we do a right thyroid lobectomy, frozen  section, if there would be a malignancy we could do a completion left  thyroid lobectomy also.  If it is benign  since the order is  predominantly all on the right side; we would only do a right thyroid  lobectomy.  She is in agreement with this, is aware of the parathyroid  glands and recurrent laryngeal nerve; hoarseness, bleeding, etcetera.  Preoperatively she had been given 1 gram of Ancef, positioned on the OR  table; and he has PAS stockings.  The patient was anesthetized by the  anesthesiologist and anesthetist.  The tube went in nicely; and we then  placed her on a fairly large roll since she is so heavy; and elevated  the head which makes the gland much more prominent.   The arms were carefully protected down at her side; and she has PAS  stockings.  Her neck was then prepped with Betadine solution and draped  in a sterile manner.  I marked the actual skin incision about 2 cm or  maybe  3 cm above the xiphoid, about 1.5 cm laterally; and then used the  suture to make an indentation, so I think the level was lying straight.  Sharp dissection down through the skin elevating the skin with pickups,  going through the platysma; and then the superior and inferior flaps  were developed.  A [place] retractor was placed.  Dr. Daphine Deutscher scrubbed in  at this time.  There was a fairly large vein right at the midline, that  we divided it inferior and tied it with a 3-0 Vicryl.  Then in the  superior aspect it actually bifurcates so that we tied both the left and  right-side of the vein with 3-0 Vicryl; and then actually removed all 3  cm of the vein, since it was really right at the midline incision.  The  midline was well to the left; it is so asymmetrical because of the  goiter; and we elevated the strap muscles going down through the  substernal muscles; and then using an [employer]; and then we switched to  an appendiceal where we elevated the strap muscles from the thyroid  gland.   At first the gland was large enough that any type of mobilization was  difficult; and I elected to go ahead and  basically checked that this was  first; and she has got essentially no isthmus; and the little area that  she does have is divided between [place] and then this was ligated with  a 2-0 Vicryl and one of 3-0 Vicryl.  With this we then could start  getting a little bit of mobilization and the inferior areas were first  sort of freed up.  Fortunately the gland itself, is kind of covered were  a pretty substantial capsule that we could work in this, freeing up the  little flimsy adhesions to the strap muscles.   Next the inferior gland vessels were divided between hemostats and  ligated with 3-0 Vicryl; and a few little baby clips so that we kind of  mobilized the inferior; and this then allowed the gland to kind of come  common anteriorly; and allowed [place] to get some mobility for the superior  pole vessels.  With my finger up there; and Dr. Daphine Deutscher, pulling with the  [employer], I was able to identify the actual superior pole vessels.  They were probably two, and they were ligated with 3-0 silk on the stay  side and 2-0 silk on the go side, and then a clip, proximal to the  suture; __________  tie vessels divided; and then this then allowed the  gland, to kind of rotate basically up.   Next we could visualize, I think, a superior parathyroid vessel also the  inferior parathyroid vessels; and these were coursed left; and the  little vessels going to the actual thyroid, and were either ligated with  3-0 Vicryl or baby clips; and then we were able to visualize the  recurrent right nerve; and could follow it; as it kind of goes into the  lateral horn of the larynx.  There may have been just a little bit of  remnants of the thyroid right on the most larynx-type area, but there  was seen to be a good cleavage plane through this with minimal bleeding;  and because of the blood supply at the superior parathyroid, I did not actually truly dissect that out; and I sent the specimen for frozen   examination.  Did the frozen since it looked like a  follicular lesion.  There is no evidence of any papillary carcinoma; and I elected not to do  the left thyroid lobectomy.  The left gland certainly feels perfectly  normal.  We kind of opened the superior aspect so we could kind of deal  with the nodules or anything worrisome.   The little area right where we dissected was good hemostasis.  I did put  just two or three little pieces of Surgicel over the kind of raw  surfaces; and then closed the strap muscle with interrupted sutures of 3-  0 Vicryl.  The platysmas muscle was closed with interrupted 3-0 Vicryl,  with 4-0 Vicryl subcuticular, and then Benzoin and Steri-Strips on the  skin.  We had completed the skin closure when the frozen was called;  and the procedure was terminated.   The total operating time was a little over 1-1/2 hours and the patient  was extubated, and taken to the recovery room in a stable postop  condition.  She will continue on her Synthroid; and should be ready for  release in the morning.           ______________________________  Anselm Pancoast. Zachery Dakins, M.D.     WJW/MEDQ  D:  02/20/2006  T:  02/20/2006  Job:  [id number]   cc:   Louanna Raw  Fax: [phone]   Dr. Maurine Minister __________'s box

## 2010-09-06 ENCOUNTER — Ambulatory Visit (INDEPENDENT_AMBULATORY_CARE_PROVIDER_SITE_OTHER): Payer: PRIVATE HEALTH INSURANCE | Admitting: Family Medicine

## 2010-09-06 VITALS — BP 124/77 | HR 67 | Ht 67.0 in | Wt 273.3 lb

## 2010-09-06 DIAGNOSIS — E119 Type 2 diabetes mellitus without complications: Secondary | ICD-10-CM

## 2010-09-06 DIAGNOSIS — E785 Hyperlipidemia, unspecified: Secondary | ICD-10-CM

## 2010-09-06 DIAGNOSIS — I10 Essential (primary) hypertension: Secondary | ICD-10-CM

## 2010-09-06 LAB — POCT GLYCOSYLATED HEMOGLOBIN (HGB A1C): Hemoglobin A1C: 7.1

## 2010-09-06 MED ORDER — LISINOPRIL-HYDROCHLOROTHIAZIDE 20-12.5 MG PO TABS: 2.0000 | ORAL_TABLET | Freq: Every day | ORAL | Status: DC

## 2010-09-06 MED ORDER — METFORMIN HCL 1000 MG PO TABS: 1000.0000 mg | ORAL_TABLET | Freq: Two times a day (BID) | ORAL | Status: DC

## 2010-09-06 MED ORDER — LEVOTHYROXINE SODIUM 125 MCG PO TABS: 125.0000 ug | ORAL_TABLET | Freq: Every day | ORAL | Status: DC

## 2010-09-06 NOTE — Assessment & Plan Note (Signed)
-   At goal.   - Continue current tx.

## 2010-09-06 NOTE — Patient Instructions (Addendum)
Follow-up in 3 months to meet your new doctor!  You can do it!

## 2010-09-06 NOTE — Progress Notes (Signed)
  Subjective:    Patient ID: Christie Estrada, female    DOB: June 24, 1960, 50 y.o.   MRN: [id number]  HPI  DIABETES  Taking and tolerating: yes Fasting blood sugars: checking twice a day, fasting 120's-130's.  Hypoglycemic symptoms: no Visual problems: no Monitoring feet: yes Numbness/Tingling: no Last eye exam: several months ago. Diabetic Labs:  Lab Results  Component Value Date   HGBA1C 6.6 03/30/2010   HGBA1C 6.1 09/29/2009   HGBA1C 7.0 04/22/2009   Lab Results  Component Value Date   LDLCALC 84 10/12/2009   CREATININE 0.88 10/12/2009   Last microalbumin: No results found for this basename: MICROALBUR, [id number]   HYPERTENSION  BP Readings from Last 3 Encounters:  09/06/10 124/77  03/30/10 148/83  09/29/09 135/85    Hypertension ROS: taking medications as instructed, no medication side effects noted, no TIA's, no chest pain on exertion, no dyspnea on exertion, no swelling of ankles and no intermittent claudication symptoms.   HYPERLIPIDEMIA  Diet: Not following low cholesterol diet, feels she has trouble feeding her underweight child while still having a diet that is healthy for her. Exercise: No regular exercise Wt Readings from Last 3 Encounters:  09/06/10 273 lb 4.8 oz (123.968 kg)  03/30/10 269 lb (122.018 kg)  09/29/09 262 lb (118.842 kg)   ROS:  Denies RUQ pain, myalgias, or symptoms or coronary ischemia Lab Results  Component Value Date   LDLCALC 84 10/12/2009   Lab Results  Component Value Date   CHOL 131 10/12/2009   CHOL 183 11/19/2006   Lab Results  Component Value Date   HDL 33* 10/12/2009   HDL 38* 11/19/2006   Lab Results  Component Value Date   TRIG 70 10/12/2009   TRIG 96 11/19/2006   Lab Results  Component Value Date   ALT 33 10/12/2009   AST 21 10/12/2009   ALKPHOS 38* 10/12/2009   BILITOT 0.4 10/12/2009   Plans on starting nutrition therapy    Review of Systemssee HPI    Objective:   Physical Exam    GEN: Alert & Oriented, No acute  distress CV:  Regular Rate & Rhythm, no murmur Respiratory:  Normal work of breathing, CTAB Abd:  + BS, soft, no tenderness to palpation Ext: no pre-tibial edema     Assessment & Plan:

## 2010-09-06 NOTE — Assessment & Plan Note (Addendum)
She is not taking statin, but it taking krill oil.  She is concerned about liver effects with Pravastatin.  Agreed to rechecks lipids in 3 months, will discuss if need for further tx at that time.  Continue lifestyle modification as discussed today.

## 2010-09-06 NOTE — Assessment & Plan Note (Signed)
Increasing a1c.  Patient will enroll, nutrition program in addition to diabetes education program.  Discussed tips for engaging in exercise daily.  Discussed with patient if a1c is > 7 at next visit, will discuss adding on additional oral agent.

## 2010-10-20 ENCOUNTER — Other Ambulatory Visit: Payer: Self-pay | Admitting: Family Medicine

## 2010-10-20 NOTE — Telephone Encounter (Signed)
Refill request

## 2010-12-21 LAB — BASIC METABOLIC PANEL
Calcium: 9.2
Creatinine, Ser: 0.85
GFR calc Af Amer: >60
GFR calc non Af Amer: >60

## 2010-12-21 LAB — URINALYSIS, ROUTINE W REFLEX MICROSCOPIC
Glucose, UA: NEGATIVE
Nitrite: NEGATIVE
Protein, ur: NEGATIVE
Urobilinogen, UA: 0.2

## 2010-12-21 LAB — DIFFERENTIAL
Eosinophils Relative: 2
Lymphocytes Relative: 38
Lymphs Abs: 2.8
Monocytes Absolute: 0.7
Neutro Abs: 3.9

## 2010-12-21 LAB — CBC
HCT: 38.8
Hemoglobin: 13.2
WBC: 7.6

## 2011-03-02 ENCOUNTER — Other Ambulatory Visit: Payer: Self-pay | Admitting: Family Medicine

## 2011-03-02 NOTE — Telephone Encounter (Signed)
Refill request

## 2011-04-09 ENCOUNTER — Other Ambulatory Visit: Payer: Self-pay | Admitting: Family Medicine

## 2011-04-09 NOTE — Telephone Encounter (Signed)
Refill request

## 2011-05-21 ENCOUNTER — Other Ambulatory Visit: Payer: Self-pay | Admitting: Family Medicine

## 2011-05-21 NOTE — Telephone Encounter (Signed)
Refill request

## 2011-06-13 ENCOUNTER — Other Ambulatory Visit: Payer: Self-pay | Admitting: Family Medicine

## 2011-06-13 MED ORDER — LEVOTHYROXINE SODIUM 125 MCG PO TABS: 125.0000 ug | ORAL_TABLET | Freq: Every day | ORAL | Status: DC

## 2012-01-08 ENCOUNTER — Telehealth: Payer: Self-pay | Admitting: *Deleted

## 2012-01-08 NOTE — Telephone Encounter (Signed)
Pt states she is being seen at different practice and does not plan on returning to [facility]. Reports last A1C 3 mos ago was 5.6.

## 2012-05-12 ENCOUNTER — Emergency Department (HOSPITAL_COMMUNITY)
Admission: EM | Admit: 2012-05-12 | Discharge: 2012-05-12 | Disposition: A | Discharge status: Home / Self Care | Payer: PRIVATE HEALTH INSURANCE | Attending: Emergency Medicine | Admitting: Emergency Medicine

## 2012-05-12 ENCOUNTER — Emergency Department (HOSPITAL_COMMUNITY): Payer: PRIVATE HEALTH INSURANCE

## 2012-05-12 ENCOUNTER — Encounter (HOSPITAL_COMMUNITY): Payer: Self-pay | Admitting: Emergency Medicine

## 2012-05-12 DIAGNOSIS — I10 Essential (primary) hypertension: Secondary | ICD-10-CM | POA: Insufficient documentation

## 2012-05-12 DIAGNOSIS — E039 Hypothyroidism, unspecified: Secondary | ICD-10-CM | POA: Insufficient documentation

## 2012-05-12 DIAGNOSIS — Y9389 Activity, other specified: Secondary | ICD-10-CM | POA: Insufficient documentation

## 2012-05-12 DIAGNOSIS — Z79899 Other long term (current) drug therapy: Secondary | ICD-10-CM | POA: Insufficient documentation

## 2012-05-12 DIAGNOSIS — T148XXA Other injury of unspecified body region, initial encounter: Secondary | ICD-10-CM

## 2012-05-12 DIAGNOSIS — Y92410 Unspecified street and highway as the place of occurrence of the external cause: Secondary | ICD-10-CM | POA: Insufficient documentation

## 2012-05-12 DIAGNOSIS — E119 Type 2 diabetes mellitus without complications: Secondary | ICD-10-CM | POA: Insufficient documentation

## 2012-05-12 HISTORY — DX: Type 2 diabetes mellitus without complications: E11.9

## 2012-05-12 HISTORY — DX: Hypothyroidism, unspecified: E03.9

## 2012-05-12 HISTORY — DX: Essential (primary) hypertension: I10

## 2012-05-12 MED ORDER — OXYCODONE HCL 5 MG PO TABS: 5.0000 mg | ORAL_TABLET | Freq: Four times a day (QID) | ORAL | Status: AC | PRN

## 2012-05-12 MED ORDER — HYDROMORPHONE HCL PF 1 MG/ML IJ SOLN
1.0000 mg | Freq: Once | INTRAMUSCULAR | Status: AC
  Administered 2012-05-12: 1 mg via INTRAMUSCULAR
  Filled 2012-05-12: qty 1

## 2012-05-12 MED ORDER — CYCLOBENZAPRINE HCL 10 MG PO TABS: 10.0000 mg | ORAL_TABLET | Freq: Three times a day (TID) | ORAL | Status: AC | PRN

## 2012-05-12 MED ORDER — ONDANSETRON 4 MG PO TBDP
4.0000 mg | ORAL_TABLET | Freq: Once | ORAL | Status: AC
  Administered 2012-05-12: 4 mg via ORAL
  Filled 2012-05-12: qty 1
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
## 2012-05-12 NOTE — ED Notes (Signed)
Patient to xray.
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
## 2012-09-18 ENCOUNTER — Other Ambulatory Visit: Payer: Self-pay

## 2015-09-24 ENCOUNTER — Encounter (HOSPITAL_BASED_OUTPATIENT_CLINIC_OR_DEPARTMENT_OTHER): Payer: Self-pay | Admitting: Emergency Medicine

## 2015-09-24 ENCOUNTER — Emergency Department (HOSPITAL_BASED_OUTPATIENT_CLINIC_OR_DEPARTMENT_OTHER)
Admission: EM | Admit: 2015-09-24 | Discharge: 2015-09-24 | Disposition: A | Discharge status: Home / Self Care | Payer: 59 | Attending: Emergency Medicine | Admitting: Emergency Medicine

## 2015-09-24 DIAGNOSIS — L03116 Cellulitis of left lower limb: Secondary | ICD-10-CM | POA: Insufficient documentation

## 2015-09-24 DIAGNOSIS — E119 Type 2 diabetes mellitus without complications: Secondary | ICD-10-CM | POA: Insufficient documentation

## 2015-09-24 DIAGNOSIS — Z7984 Long term (current) use of oral hypoglycemic drugs: Secondary | ICD-10-CM | POA: Insufficient documentation

## 2015-09-24 DIAGNOSIS — I10 Essential (primary) hypertension: Secondary | ICD-10-CM | POA: Insufficient documentation

## 2015-09-24 DIAGNOSIS — R21 Rash and other nonspecific skin eruption: Secondary | ICD-10-CM | POA: Diagnosis present

## 2015-09-24 DIAGNOSIS — E039 Hypothyroidism, unspecified: Secondary | ICD-10-CM | POA: Insufficient documentation

## 2015-09-24 MED ORDER — DOXYCYCLINE HYCLATE 100 MG PO CAPS: 100.0000 mg | ORAL_CAPSULE | Freq: Two times a day (BID) | ORAL | Status: DC

## 2015-09-24 NOTE — ED Provider Notes (Signed)
CSN: [id number]     Arrival date & time 09/24/15  1624 History  By signing my name below, I, Emmanuella Mensah, attest that this documentation has been prepared under the direction and in the presence of Cheri Fowler, PA-C. Electronically Signed: Angelene Giovanni, ED Scribe. 09/24/2015. 4:53 PM.      Chief Complaint  Patient presents with  . Insect Bite   The history is provided by the patient. No language interpreter was used.   HPI Comments: Christie Estrada is a 55 y.o. female with a hx of hypertension, DM, and hypothyroidism who presents to the Emergency Department for evaluation s/p possible insect bite to left upper leg that occurred 3 days ago. Pt explains that the area started as 3 "bumps" but now the area is spreading with hardness, redness, and warmth. No drainage. No alleviating factors noted. She states that she has tried OTC anti-itch cream. She denies any fever, chills, SOB, or n/v.    Past Medical History  Diagnosis Date  . Hypertension   . Diabetes mellitus without complication (HCC)   . Hypothyroidism    Past Surgical History  Procedure Laterality Date  . Shoulder surgery    . Hernia repair     History reviewed. No pertinent family history. Social History  Substance Use Topics  . Smoking status: Never Smoker   . Smokeless tobacco: None  . Alcohol Use: None   OB History    No data available     Review of Systems  Constitutional: Negative for fever and chills.  Respiratory: Negative for shortness of breath.   Gastrointestinal: Negative for nausea and vomiting.  Skin: Positive for color change and rash. Negative for wound.  All other systems reviewed and are negative.     Allergies  Oxycodone-acetaminophen  Home Medications   Prior to Admission medications   Medication Sig Start Date End Date Taking? Authorizing Provider  cyclobenzaprine (FLEXERIL) 10 MG tablet Take 1 tablet (10 mg total) by mouth 3 (three) times daily as needed for muscle spasms.  05/12/12   Gwyneth Sprout, MD  doxycycline (VIBRAMYCIN) 100 MG capsule Take 1 capsule (100 mg total) by mouth 2 (two) times daily. 09/24/15   Cheri Fowler, PA-C  levothyroxine (SYNTHROID, LEVOTHROID) 125 MCG tablet Take 125 mcg by mouth daily. 06/13/11   Macy Mis, MD  lisinopril-hydrochlorothiazide (PRINZIDE,ZESTORETIC) 20-12.5 MG per tablet Take 1 tablet by mouth daily.    Historical Provider, MD  metFORMIN (GLUCOPHAGE) 1000 MG tablet Take 1,000 mg by mouth 2 (two) times daily with a meal.    Historical Provider, MD  oxyCODONE (ROXICODONE) 5 MG immediate release tablet Take 1 tablet (5 mg total) by mouth every 6 (six) hours as needed for pain. 05/12/12   Gwyneth Sprout, MD   BP 148/81 mmHg  Pulse 76  Temp(Src) 98.2 F (36.8 C) (Oral)  Resp 18  Ht  (1.676 m)  Wt 116.574 kg  BMI 41.50 kg/m2  SpO2 100% Physical Exam  Constitutional: She is oriented to person, place, and time. She appears well-developed and well-nourished.  HENT:  Head: Normocephalic and atraumatic.  Right Ear: External ear normal.  Left Ear: External ear normal.  Eyes: Conjunctivae are normal. No scleral icterus.  Neck: No tracheal deviation present.  Pulmonary/Chest: Effort normal. No respiratory distress.  Abdominal: She exhibits no distension.  Musculoskeletal: Normal range of motion.  Neurological: She is alert and oriented to person, place, and time.  Skin: Skin is warm and dry.  Left proximal thigh: Area  of warmth, erythema, and induration about 7x7 cm.  No fluctuance.  2 smaller areas on lateral aspect of the proximal thigh with warmth, induration, and erythema each approximately 4x4 cm.  Psychiatric: She has a normal mood and affect. Her behavior is normal.    ED Course  Procedures (including critical care time) DIAGNOSTIC STUDIES: Oxygen Saturation is 100% on RA, normal by my interpretation.    COORDINATION OF CARE: 4:53 PM- Pt advised of plan for treatment and pt agrees. Pt will receive doxycyline.  Advised to use warm compresses and use Ibuprofen or Tylenol for discomfort or Zyrtec for the itching. Return precautions discussed.   MDM   Final diagnoses:  Cellulitis of left lower extremity   Findings consistent with cellulitis.  No systemic symptoms.  Patient appears well, non-toxic.  Discussed warm compresses and treatment with Doxycycline.  Benadryl for itching.  Recommend Tylenol or Ibuprofen for pain.  Discussed return precautions.  Patient agrees and acknowledges the above plan for discharge.   I personally performed the services described in this documentation, which was scribed in my presence. The recorded information has been reviewed and is accurate.   Cheri FowlerKayla Genette Huertas, PA-C 09/24/15 1658  Vanetta MuldersScott Zackowski, MD 09/26/15 [phone]

## 2015-09-24 NOTE — Discharge Instructions (Signed)
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
## 2015-09-24 NOTE — ED Notes (Signed)
Pt given discharge instructions and given opportunity to ask questions.  

## 2016-03-24 ENCOUNTER — Emergency Department (HOSPITAL_BASED_OUTPATIENT_CLINIC_OR_DEPARTMENT_OTHER)
Admission: EM | Admit: 2016-03-24 | Discharge: 2016-03-24 | Disposition: A | Discharge status: Home / Self Care | Payer: 59 | Attending: Emergency Medicine | Admitting: Emergency Medicine

## 2016-03-24 ENCOUNTER — Encounter (HOSPITAL_BASED_OUTPATIENT_CLINIC_OR_DEPARTMENT_OTHER): Payer: Self-pay | Admitting: Emergency Medicine

## 2016-03-24 ENCOUNTER — Emergency Department (HOSPITAL_BASED_OUTPATIENT_CLINIC_OR_DEPARTMENT_OTHER): Payer: 59

## 2016-03-24 DIAGNOSIS — I10 Essential (primary) hypertension: Secondary | ICD-10-CM | POA: Insufficient documentation

## 2016-03-24 DIAGNOSIS — Z79899 Other long term (current) drug therapy: Secondary | ICD-10-CM | POA: Diagnosis not present

## 2016-03-24 DIAGNOSIS — E039 Hypothyroidism, unspecified: Secondary | ICD-10-CM | POA: Insufficient documentation

## 2016-03-24 DIAGNOSIS — E119 Type 2 diabetes mellitus without complications: Secondary | ICD-10-CM | POA: Insufficient documentation

## 2016-03-24 DIAGNOSIS — R0602 Shortness of breath: Secondary | ICD-10-CM | POA: Diagnosis present

## 2016-03-24 DIAGNOSIS — J40 Bronchitis, not specified as acute or chronic: Secondary | ICD-10-CM | POA: Diagnosis not present

## 2016-03-24 DIAGNOSIS — Z7984 Long term (current) use of oral hypoglycemic drugs: Secondary | ICD-10-CM | POA: Insufficient documentation

## 2016-03-24 LAB — CBC WITH DIFFERENTIAL/PLATELET
BAND NEUTROPHILS: 0 %
BASOS ABS: 0 10*3/uL (ref 0.0–0.1)
Basophils Relative: 0 %
Blasts: 0 %
EOS ABS: 0.1 10*3/uL (ref 0.0–0.7)
EOS PCT: 1 %
HCT: 38.2 % (ref 36.0–46.0)
Hemoglobin: 12.5 g/dL (ref 12.0–15.0)
LYMPHS ABS: 3.5 10*3/uL (ref 0.7–4.0)
LYMPHS PCT: 43 %
MCH: 29.5 pg (ref 26.0–34.0)
MCHC: 32.7 g/dL (ref 30.0–36.0)
MCV: 90.1 fL (ref 78.0–100.0)
METAMYELOCYTES PCT: 2 %
MONOS PCT: 10 %
Monocytes Absolute: 0.8 10*3/uL (ref 0.1–1.0)
Myelocytes: 0 %
NEUTROS ABS: 3.8 10*3/uL (ref 1.7–7.7)
Neutrophils Relative %: 44 %
OTHER: 0 %
PLATELETS: 366 10*3/uL (ref 150–400)
Promyelocytes Absolute: 0 %
RBC: 4.24 MIL/uL (ref 3.87–5.11)
RDW: 13.1 % (ref 11.5–15.5)
WBC: 8.2 10*3/uL (ref 4.0–10.5)
nRBC: 0 /100 WBC

## 2016-03-24 LAB — BASIC METABOLIC PANEL
Anion gap: 7 (ref 5–15)
BUN: 14 mg/dL (ref 6–20)
CHLORIDE: 102 mmol/L (ref 101–111)
CO2: 32 mmol/L (ref 22–32)
Calcium: 9.5 mg/dL (ref 8.9–10.3)
Creatinine, Ser: 0.96 mg/dL (ref 0.44–1.00)
GFR calc Af Amer: >60 mL/min (ref >60)
GFR calc non Af Amer: >60 mL/min (ref >60)
GLUCOSE: 149 mg/dL — AB (ref 65–99)
Potassium: 3.4 mmol/L — ABNORMAL LOW (ref 3.5–5.1)
Sodium: 141 mmol/L (ref 135–145)

## 2016-03-24 LAB — BRAIN NATRIURETIC PEPTIDE: B NATRIURETIC PEPTIDE 5: 35.1 pg/mL (ref 0.0–100.0)

## 2016-03-24 LAB — TROPONIN I: Troponin I: <0.03 ng/mL (ref <0.03)

## 2016-03-24 MED ORDER — ALBUTEROL SULFATE (2.5 MG/3ML) 0.083% IN NEBU
5.0000 mg | INHALATION_SOLUTION | Freq: Once | RESPIRATORY_TRACT | Status: AC
  Administered 2016-03-24: 5 mg via RESPIRATORY_TRACT
  Filled 2016-03-24: qty 6

## 2016-03-24 MED ORDER — AZITHROMYCIN 250 MG PO TABS: ORAL_TABLET | ORAL | 0 refills | Status: AC

## 2016-03-24 MED ORDER — IPRATROPIUM BROMIDE 0.02 % IN SOLN
0.5000 mg | Freq: Once | RESPIRATORY_TRACT | Status: AC
  Administered 2016-03-24: 0.5 mg via RESPIRATORY_TRACT
  Filled 2016-03-24: qty 2.5

## 2016-03-24 MED ORDER — ALBUTEROL SULFATE HFA 108 (90 BASE) MCG/ACT IN AERS: 2.0000 | INHALATION_SPRAY | Freq: Four times a day (QID) | RESPIRATORY_TRACT | 0 refills | Status: AC | PRN

## 2016-03-24 MED ORDER — ALBUTEROL SULFATE (2.5 MG/3ML) 0.083% IN NEBU
2.5000 mg | INHALATION_SOLUTION | Freq: Once | RESPIRATORY_TRACT | Status: AC
  Administered 2016-03-24: 2.5 mg via RESPIRATORY_TRACT
  Filled 2016-03-24: qty 3

## 2016-03-24 MED ORDER — METHYLPREDNISOLONE SODIUM SUCC 125 MG IJ SOLR
125.0000 mg | Freq: Once | INTRAMUSCULAR | Status: AC
  Administered 2016-03-24: 125 mg via INTRAVENOUS
  Filled 2016-03-24: qty 2

## 2016-03-24 MED ORDER — IPRATROPIUM-ALBUTEROL 0.5-2.5 (3) MG/3ML IN SOLN
3.0000 mL | Freq: Once | RESPIRATORY_TRACT | Status: AC
  Administered 2016-03-24: 3 mL via RESPIRATORY_TRACT
  Filled 2016-03-24: qty 3

## 2016-03-24 MED ORDER — PREDNISONE 20 MG PO TABS: 40.0000 mg | ORAL_TABLET | Freq: Every day | ORAL | 0 refills | Status: AC

## 2016-03-24 NOTE — ED Notes (Signed)
96-98% on RA while ambulating.

## 2016-03-24 NOTE — ED Provider Notes (Signed)
[facility]   CSN: [id number] Arrival date & time: 03/24/16  1140     History   Chief Complaint Chief Complaint  Patient presents with  . Influenza    HPI Christie Estrada is a 56 y.o. female with PMH of DM2, Hypothyroidism, HTN who presents with cough and shortness of breath.   HPI Patient reports of cough, HA, body aches for the past 9 days which worsened in the past few days. Reports of rhinorrhea but no sinus pressure. Has noticed she gets short of breath when she is talking on the phone over the past few days and also is unable to lay flat because she feels "like I am choking". Feels like her has tightness in her chest. Has had fevers with t max of 102F; does improve with Tylenol with last dose at 7Am today. Has tried tessalon pearls, robitussin DM, theraflu without much improvement. Denies LE swelling, abdominal pain, diarrhea, sore throat. Reports of decreased PO intake but good fluid intake. No history of asthma but daughter has asthma. No smoking history.   Past Medical History:  Diagnosis Date  . Diabetes mellitus without complication (HCC)   . Hypertension   . Hypothyroidism     Patient Active Problem List   Diagnosis Date Noted  . FOLLICULITIS 03/30/2010  . BACK PAIN, ACUTE 04/22/2009  . RECTAL BLEEDING 08/05/2008  . PES PLANUS 07/21/2008  . FATIGUE, CHRONIC 07/15/2008  . PLANTAR FASCIITIS, LEFT 01/09/2008  . HYPERLIPIDEMIA 08/20/2007  . HYPOTHYROIDISM NOS 02/21/2007  . DM, UNCOMPLICATED, TYPE II 11/19/2006  . MORBID OBESITY 05/09/2006  . HYPERTENSION, BENIGN SYSTEMIC 05/09/2006  . DIVERTICULOSIS OF COLON 05/09/2006  . SPONDYLOSIS, CERVICAL, WITH RADICULOPATHY 03/17/2006  . ROTATOR CUFF REPAIR, RIGHT, HX OF 03/18/2003    Past Surgical History:  Procedure Laterality Date  . HERNIA REPAIR    . SHOULDER SURGERY      OB History    No data available       Home Medications    Prior to Admission medications   Medication  Sig Start Date End Date Taking? Authorizing Provider  levothyroxine (SYNTHROID, LEVOTHROID) 125 MCG tablet Take 125 mcg by mouth daily. 06/13/11  Yes Macy MisKim K Briscoe, MD  lisinopril-hydrochlorothiazide (PRINZIDE,ZESTORETIC) 20-12.5 MG per tablet Take 1 tablet by mouth daily.   Yes Historical Provider, MD  metFORMIN (GLUCOPHAGE) 1000 MG tablet Take 1,000 mg by mouth 2 (two) times daily with a meal.   Yes Historical Provider, MD  Multiple Vitamin (MULTIVITAMIN) capsule Take 1 capsule by mouth daily.   Yes Historical Provider, MD  cyclobenzaprine (FLEXERIL) 10 MG tablet Take 1 tablet (10 mg total) by mouth 3 (three) times daily as needed for muscle spasms. 05/12/12   Gwyneth SproutWhitney Plunkett, MD  doxycycline (VIBRAMYCIN) 100 MG capsule Take 1 capsule (100 mg total) by mouth 2 (two) times daily. 09/24/15   Cheri FowlerKayla Rose, PA-C  oxyCODONE (ROXICODONE) 5 MG immediate release tablet Take 1 tablet (5 mg total) by mouth every 6 (six) hours as needed for pain. 05/12/12   Gwyneth SproutWhitney Plunkett, MD    Family History No family history on file.  Social History Social History  Substance Use Topics  . Smoking status: Never Smoker  . Smokeless tobacco: Never Used  . Alcohol use No     Allergies   Oxycodone-acetaminophen   Review of Systems Review of Systems  Constitutional: Positive for chills, fatigue and fever.  HENT: Positive for rhinorrhea. Negative for sinus pain and sinus pressure.   Respiratory:  Positive for cough, chest tightness, shortness of breath and wheezing.   Gastrointestinal: Negative for abdominal pain, diarrhea and vomiting.     Physical Exam Updated Vital Signs BP 120/65 (BP Location: Left Arm)   Pulse 72   Temp 98.2 F (36.8 C) (Oral)   Resp 22   Ht 5\' 6"  (1.676 m)   Wt 112.5 kg   SpO2 99%   BMI 40.03 kg/m   Physical Exam  Constitutional: She is oriented to person, place, and time. She appears well-developed and well-nourished. No distress.  HENT:  Nose: Nose normal.  Mouth/Throat:  Oropharynx is clear and moist.  Eyes: Conjunctivae are normal. Pupils are equal, round, and reactive to light.  Neck: Normal range of motion. Neck supple. No JVD present.  Cardiovascular: Normal rate, regular rhythm and normal heart sounds.  Exam reveals no gallop and no friction rub.   No murmur heard. Pulmonary/Chest: Effort normal. She has wheezes. She has no rales. She exhibits no tenderness.  Abdominal: Soft. Bowel sounds are normal. There is no tenderness.  Lymphadenopathy:    She has cervical adenopathy (minimal cervical lymphadenopathy).  Neurological: She is alert and oriented to person, place, and time.  Skin: Skin is warm and dry. Capillary refill takes less than 2 seconds.  Psychiatric: She has a normal mood and affect. Her behavior is normal.     ED Treatments / Results  Labs (all labs ordered are listed, but only abnormal results are displayed) Labs Reviewed  BASIC METABOLIC PANEL - Abnormal; Notable for the following:       Result Value   Potassium 3.4 (*)    Glucose, Bld 149 (*)    All other components within normal limits  CBC WITH DIFFERENTIAL/PLATELET  TROPONIN I  BRAIN NATRIURETIC PEPTIDE    EKG  EKG Interpretation None       Radiology Dg Chest 2 View  Result Date: 03/24/2016 CLINICAL DATA:  Cough congestion and wheezing. EXAM: CHEST  2 VIEW COMPARISON:  None. FINDINGS: Normal heart, mediastinum and hila. Clear lungs. No pleural effusion.  No pneumothorax. Skeletal structures are unremarkable. IMPRESSION: No active cardiopulmonary disease. Electronically Signed   By: Amie Portland M.D.   On: 03/24/2016 12:49    Procedures Procedures (including critical care time)  Medications Ordered in ED Medications  ipratropium-albuterol (DUONEB) 0.5-2.5 (3) MG/3ML nebulizer solution 3 mL (3 mLs Nebulization Given 03/24/16 1216)  albuterol (PROVENTIL) (2.5 MG/3ML) 0.083% nebulizer solution 2.5 mg (2.5 mg Nebulization Given 03/24/16 1216)  albuterol (PROVENTIL)  (2.5 MG/3ML) 0.083% nebulizer solution 5 mg (5 mg Nebulization Given 03/24/16 1414)  ipratropium (ATROVENT) nebulizer solution 0.5 mg (0.5 mg Nebulization Given 03/24/16 1414)  methylPREDNISolone sodium succinate (SOLU-MEDROL) 125 mg/2 mL injection 125 mg (125 mg Intravenous Given 03/24/16 1435)     Initial Impression / Assessment and Plan / ED Course  I have reviewed the triage vital signs and the nursing notes.  Pertinent labs & imaging results that were available during my care of the patient were reviewed by me and considered in my medical decision making (see chart for details).  Clinical Course    55yo patient with history of DM, HTN, Hypothyroidism, Obesity presents with cough, shortness of breath, fever for 9 days with worsening symptoms over the past few days. Afebrile and stable vitals. On exam, significant for diffuse wheezing. No personal history of asthma. No smoking history. Will obtain CBC, BMP, BNP, and troponin and EKG. Order duoneb   Lab work is unremarkable. Patient reports of somewhat  improved symptoms with duoneb. Is getting solumedrol x 1 now. Will ambulate patient with pulse oximetry. No desaturations with ambulation.   Symptoms likely consisted with bronchitis. Patient's vitals are stable and is stable for discharge home with Azithromycin, Prednisone, and PRN Albuterol. Return precautions discussed.   Final Clinical Impressions(s) / ED Diagnoses   Final diagnoses:  Bronchitis    New Prescriptions New Prescriptions   No medications on file     Palma Holter, MD 03/24/16 1533    Charlynne Pander, MD 03/24/16 1905

## 2016-03-24 NOTE — ED Triage Notes (Signed)
About 8 days ago, with flu like symptoms, getting worse. Not able to sleep due to cough

## 2016-03-24 NOTE — Discharge Instructions (Signed)
Your symptoms are most consistent with bronchitis. Your chest x-ray showed no signs of pneumonia. Please take Azithromycin and Prednisone as prescribed. Predisone 40mg  daily for 5 days. Azithromycin 500mg  the first day, then 250mg  daily for the next 4 days. You can take albuterol inhaler as needed.  Please seek immediate medical attention if you have difficulty breathing.

## 2016-03-24 NOTE — ED Notes (Signed)
EKG delayed due to uncooperative phillips monitor.

## 2016-03-24 NOTE — ED Notes (Signed)
ED Provider at bedside. 

## 2017-04-03 ENCOUNTER — Other Ambulatory Visit: Payer: Self-pay | Admitting: Internal Medicine

## 2017-04-03 DIAGNOSIS — Z1231 Encounter for screening mammogram for malignant neoplasm of breast: Secondary | ICD-10-CM

## 2017-04-25 ENCOUNTER — Ambulatory Visit: Payer: PRIVATE HEALTH INSURANCE

## 2019-02-11 ENCOUNTER — Other Ambulatory Visit: Payer: Self-pay

## 2019-02-11 DIAGNOSIS — Z20822 Contact with and (suspected) exposure to covid-19: Secondary | ICD-10-CM

## 2019-02-14 LAB — NOVEL CORONAVIRUS, NAA: SARS-CoV-2, NAA: NOT DETECTED

## 2019-02-16 ENCOUNTER — Other Ambulatory Visit: Payer: Self-pay

## 2019-02-16 DIAGNOSIS — Z20822 Contact with and (suspected) exposure to covid-19: Secondary | ICD-10-CM

## 2019-02-17 LAB — NOVEL CORONAVIRUS, NAA: SARS-CoV-2, NAA: NOT DETECTED

## 2019-02-18 ENCOUNTER — Telehealth: Payer: Self-pay

## 2019-02-18 NOTE — Telephone Encounter (Signed)
Caller given negative result and verbalized understanding  

## 2019-07-17 ENCOUNTER — Other Ambulatory Visit: Payer: Self-pay | Admitting: Orthopedic Surgery

## 2019-07-17 DIAGNOSIS — M25512 Pain in left shoulder: Secondary | ICD-10-CM

## 2019-08-02 ENCOUNTER — Ambulatory Visit
Admission: RE | Admit: 2019-08-02 | Discharge: 2019-08-02 | Disposition: A | Discharge status: Home / Self Care | Payer: No Typology Code available for payment source | Source: Ambulatory Visit | Attending: Orthopedic Surgery | Admitting: Orthopedic Surgery

## 2019-08-02 ENCOUNTER — Other Ambulatory Visit: Payer: Self-pay

## 2019-08-02 DIAGNOSIS — M25512 Pain in left shoulder: Secondary | ICD-10-CM

## 2019-08-15 ENCOUNTER — Other Ambulatory Visit: Payer: PRIVATE HEALTH INSURANCE

## 2019-09-05 ENCOUNTER — Emergency Department (HOSPITAL_BASED_OUTPATIENT_CLINIC_OR_DEPARTMENT_OTHER)
Admission: EM | Admit: 2019-09-05 | Discharge: 2019-09-05 | Disposition: A | Discharge status: Home / Self Care | Payer: No Typology Code available for payment source | Attending: Emergency Medicine | Admitting: Emergency Medicine

## 2019-09-05 ENCOUNTER — Other Ambulatory Visit: Payer: Self-pay

## 2019-09-05 ENCOUNTER — Encounter (HOSPITAL_BASED_OUTPATIENT_CLINIC_OR_DEPARTMENT_OTHER): Payer: Self-pay | Admitting: Emergency Medicine

## 2019-09-05 ENCOUNTER — Emergency Department (HOSPITAL_BASED_OUTPATIENT_CLINIC_OR_DEPARTMENT_OTHER): Payer: No Typology Code available for payment source

## 2019-09-05 DIAGNOSIS — Z7984 Long term (current) use of oral hypoglycemic drugs: Secondary | ICD-10-CM | POA: Diagnosis not present

## 2019-09-05 DIAGNOSIS — E039 Hypothyroidism, unspecified: Secondary | ICD-10-CM | POA: Diagnosis not present

## 2019-09-05 DIAGNOSIS — Z885 Allergy status to narcotic agent status: Secondary | ICD-10-CM | POA: Diagnosis not present

## 2019-09-05 DIAGNOSIS — R10823 Right lower quadrant rebound abdominal tenderness: Secondary | ICD-10-CM | POA: Diagnosis not present

## 2019-09-05 DIAGNOSIS — E119 Type 2 diabetes mellitus without complications: Secondary | ICD-10-CM | POA: Insufficient documentation

## 2019-09-05 DIAGNOSIS — Z79899 Other long term (current) drug therapy: Secondary | ICD-10-CM | POA: Insufficient documentation

## 2019-09-05 DIAGNOSIS — M545 Low back pain, unspecified: Secondary | ICD-10-CM

## 2019-09-05 DIAGNOSIS — I10 Essential (primary) hypertension: Secondary | ICD-10-CM | POA: Insufficient documentation

## 2019-09-05 LAB — CBC WITH DIFFERENTIAL/PLATELET
Abs Immature Granulocytes: 0.03 10*3/uL (ref 0.00–0.07)
Basophils Absolute: 0.1 10*3/uL (ref 0.0–0.1)
Basophils Relative: 1 %
Eosinophils Absolute: 0.2 10*3/uL (ref 0.0–0.5)
Eosinophils Relative: 3 %
HCT: 38.9 % (ref 36.0–46.0)
Hemoglobin: 12.2 g/dL (ref 12.0–15.0)
Immature Granulocytes: 0 %
Lymphocytes Relative: 33 %
Lymphs Abs: 2.6 10*3/uL (ref 0.7–4.0)
MCH: 29.8 pg (ref 26.0–34.0)
MCHC: 31.4 g/dL (ref 30.0–36.0)
MCV: 95.1 fL (ref 80.0–100.0)
Monocytes Absolute: 0.6 10*3/uL (ref 0.1–1.0)
Monocytes Relative: 7 %
Neutro Abs: 4.6 10*3/uL (ref 1.7–7.7)
Neutrophils Relative %: 56 %
Platelets: 319 10*3/uL (ref 150–400)
RBC: 4.09 MIL/uL (ref 3.87–5.11)
RDW: 13.2 % (ref 11.5–15.5)
WBC: 8 10*3/uL (ref 4.0–10.5)
nRBC: 0 % (ref 0.0–0.2)

## 2019-09-05 LAB — COMPREHENSIVE METABOLIC PANEL
ALT: 27 U/L (ref 0–44)
AST: 20 U/L (ref 15–41)
Albumin: 3.8 g/dL (ref 3.5–5.0)
Alkaline Phosphatase: 43 U/L (ref 38–126)
Anion gap: 9 (ref 5–15)
BUN: 15 mg/dL (ref 6–20)
CO2: 30 mmol/L (ref 22–32)
Calcium: 9.1 mg/dL (ref 8.9–10.3)
Chloride: 99 mmol/L (ref 98–111)
Creatinine, Ser: 0.84 mg/dL (ref 0.44–1.00)
GFR calc Af Amer: >60 mL/min (ref >60)
GFR calc non Af Amer: >60 mL/min (ref >60)
Glucose, Bld: 225 mg/dL — ABNORMAL HIGH (ref 70–99)
Potassium: 3.8 mmol/L (ref 3.5–5.1)
Sodium: 138 mmol/L (ref 135–145)
Total Bilirubin: 0.7 mg/dL (ref 0.3–1.2)
Total Protein: 7 g/dL (ref 6.5–8.1)

## 2019-09-05 LAB — URINALYSIS, ROUTINE W REFLEX MICROSCOPIC
Bilirubin Urine: NEGATIVE
Glucose, UA: 250 mg/dL — AB
Hgb urine dipstick: NEGATIVE
Ketones, ur: NEGATIVE mg/dL
Leukocytes,Ua: NEGATIVE
Nitrite: NEGATIVE
Protein, ur: NEGATIVE mg/dL
Specific Gravity, Urine: 1.02 (ref 1.005–1.030)
pH: 6.5 (ref 5.0–8.0)

## 2019-09-05 LAB — LIPASE, BLOOD: Lipase: 29 U/L (ref 11–51)

## 2019-09-05 MED ORDER — KETOROLAC TROMETHAMINE 15 MG/ML IJ SOLN
15.0000 mg | Freq: Once | INTRAMUSCULAR | Status: AC
  Administered 2019-09-05: 11:00:00 15 mg via INTRAVENOUS
  Filled 2019-09-05: qty 1

## 2019-09-05 MED ORDER — FENTANYL CITRATE (PF) 100 MCG/2ML IJ SOLN: 50.0000 ug | Freq: Once | INTRAMUSCULAR | Status: DC

## 2019-09-05 MED ORDER — MELOXICAM 7.5 MG PO TABS: 7.5000 mg | ORAL_TABLET | Freq: Every day | ORAL | 0 refills | Status: AC | PRN

## 2019-09-05 NOTE — ED Provider Notes (Signed)
[facility] EMERGENCY DEPARTMENT Provider Note   CSN: [id number] Arrival date & time: 09/05/19  0831     History Chief Complaint  Patient presents with  . Back Pain    Christie Estrada is a 59 y.o. female.  The history is provided by the patient.  Back Pain Location:  Lumbar spine Quality:  Aching Radiates to:  Does not radiate Pain severity:  Mild Onset quality:  Gradual Duration:  10 days Timing:  Intermittent Progression:  Waxing and waning Chronicity:  New Context: twisting (makes pain worse, no hx of Kidney stone but concerned about one)   Relieved by:  Nothing Worsened by:  Ambulation and twisting Associated symptoms: no abdominal pain, no abdominal swelling, no bladder incontinence, no bowel incontinence, no chest pain, no dysuria, no fever, no headaches, no leg pain, no numbness, no paresthesias, no pelvic pain, no perianal numbness, no tingling, no weakness and no weight loss        Past Medical History:  Diagnosis Date  . Diabetes mellitus without complication (HCC)   . Hypertension   . Hypothyroidism     Patient Active Problem List   Diagnosis Date Noted  . FOLLICULITIS 03/30/2010  . BACK PAIN, ACUTE 04/22/2009  . RECTAL BLEEDING 08/05/2008  . PES PLANUS 07/21/2008  . FATIGUE, CHRONIC 07/15/2008  . PLANTAR FASCIITIS, LEFT 01/09/2008  . HYPERLIPIDEMIA 08/20/2007  . HYPOTHYROIDISM NOS 02/21/2007  . DM, UNCOMPLICATED, TYPE II 11/19/2006  . MORBID OBESITY 05/09/2006  . HYPERTENSION, BENIGN SYSTEMIC 05/09/2006  . DIVERTICULOSIS OF COLON 05/09/2006  . SPONDYLOSIS, CERVICAL, WITH RADICULOPATHY 03/17/2006  . ROTATOR CUFF REPAIR, RIGHT, HX OF 03/18/2003    Past Surgical History:  Procedure Laterality Date  . HERNIA REPAIR    . SHOULDER SURGERY       OB History   No obstetric history on file.     History reviewed. No pertinent family history.  Social History   Tobacco Use  . Smoking status: Never Smoker  . Smokeless tobacco:  Never Used  Substance Use Topics  . Alcohol use: No  . Drug use: No    Home Medications Prior to Admission medications   Medication Sig Start Date End Date Taking? Authorizing Provider  albuterol (PROVENTIL HFA;VENTOLIN HFA) 108 (90 Base) MCG/ACT inhaler Inhale 2 puffs into the lungs every 6 (six) hours as needed for wheezing or shortness of breath. 03/24/16   Palma Holter, MD  azithromycin (ZITHROMAX) 250 MG tablet 500mg  (2 tablets) on day 1, then 250mg  daily for 4 more days. 03/24/16   , MD  cyclobenzaprine (FLEXERIL) 10 MG tablet Take 1 tablet (10 mg total) by mouth 3 (three) times daily as needed for muscle spasms. 05/12/12   Palma Holter, MD  levothyroxine (SYNTHROID, LEVOTHROID) 125 MCG tablet Take 125 mcg by mouth daily. 06/13/11   Gwyneth Sprout, MD  lisinopril-hydrochlorothiazide (PRINZIDE,ZESTORETIC) 20-12.5 MG per tablet Take 1 tablet by mouth daily.    [provider]  meloxicam (MOBIC) 7.5 MG tablet Take 1 tablet (7.5 mg total) by mouth daily as needed for up to 30 doses for pain. 09/05/19   Avika Carbine, DO  metFORMIN (GLUCOPHAGE) 1000 MG tablet Take 1,000 mg by mouth 2 (two) times daily with a meal.    [provider]  Multiple Vitamin (MULTIVITAMIN) capsule Take 1 capsule by mouth daily.    [provider]  oxyCODONE (ROXICODONE) 5 MG immediate release tablet Take 1 tablet (5 mg total) by mouth every 6 (six)  hours as needed for pain. 05/12/12   Gwyneth Sprout, MD  predniSONE (DELTASONE) 20 MG tablet Take 2 tablets (40 mg total) by mouth daily with breakfast. 03/24/16   Palma Holter, MD    Allergies    Oxycodone-acetaminophen  Review of Systems   Review of Systems  Constitutional: Negative for chills, fever and weight loss.  HENT: Negative for ear pain and sore throat.   Eyes: Negative for pain and visual disturbance.  Respiratory: Negative for cough and shortness of breath.   Cardiovascular: Negative for  chest pain and palpitations.  Gastrointestinal: Negative for abdominal pain, bowel incontinence and vomiting.  Genitourinary: Negative for bladder incontinence, dysuria, hematuria and pelvic pain.  Musculoskeletal: Positive for back pain (flank pain on right). Negative for arthralgias, gait problem, joint swelling, neck pain and neck stiffness.  Skin: Negative for color change and rash.  Neurological: Negative for tingling, seizures, syncope, weakness, numbness, headaches and paresthesias.  All other systems reviewed and are negative.   Physical Exam Updated Vital Signs  ED Triage Vitals  Enc Vitals Group     BP 09/05/19 0843 125/62     Pulse Rate 09/05/19 0843 66     Resp 09/05/19 0843 18     Temp 09/05/19 0843 98.1 F (36.7 C)     Temp Source 09/05/19 0843 Oral     SpO2 09/05/19 0843 99 %     Weight 09/05/19 0843 250 lb (113.4 kg)     Height 09/05/19 0843 5' 6.5" (1.689 m)     Head Circumference --      Peak Flow --      Pain Score 09/05/19 0853 8     Pain Loc --      Pain Edu? --      Excl. in GC? --     Physical Exam Vitals and nursing note reviewed.  Constitutional:      General: She is not in acute distress.    Appearance: She is well-developed.  HENT:     Head: Normocephalic and atraumatic.     Nose: Nose normal.     Mouth/Throat:     Mouth: Mucous membranes are moist.  Eyes:     Extraocular Movements: Extraocular movements intact.     Conjunctiva/sclera: Conjunctivae normal.     Pupils: Pupils are equal, round, and reactive to light.  Cardiovascular:     Rate and Rhythm: Normal rate and regular rhythm.     Pulses: Normal pulses.     Heart sounds: Normal heart sounds. No murmur heard.   Pulmonary:     Effort: Pulmonary effort is normal. No respiratory distress.     Breath sounds: Normal breath sounds.  Abdominal:     Palpations: Abdomen is soft.     Tenderness: There is no abdominal tenderness. There is right CVA tenderness.  Musculoskeletal:      Cervical back: Normal range of motion and neck supple. No tenderness.     Comments: Tenderness to right CVA area and right lower lumbar area, no midline spinal tenderness  Skin:    General: Skin is warm and dry.  Neurological:     General: No focal deficit present.     Mental Status: She is alert and oriented to person, place, and time.     Cranial Nerves: No cranial nerve deficit.     Sensory: No sensory deficit.     Motor: No weakness.     Coordination: Coordination normal.     Gait: Gait normal.  Comments: 5+ out of 5 strength throughout, normal sensation, no drift, normal finger-to-nose finger     ED Results / Procedures / Treatments   Labs (all labs ordered are listed, but only abnormal results are displayed) Labs Reviewed  COMPREHENSIVE METABOLIC PANEL - Abnormal; Notable for the following components:      Result Value   Glucose, Bld 225 (*)    All other components within normal limits  URINALYSIS, ROUTINE W REFLEX MICROSCOPIC - Abnormal; Notable for the following components:   Glucose, UA 250 (*)    All other components within normal limits  CBC WITH DIFFERENTIAL/PLATELET  LIPASE, BLOOD    EKG None  Radiology CT Renal Stone Study  Result Date: 09/05/2019 CLINICAL DATA:  Right flank pain. EXAM: CT ABDOMEN AND PELVIS WITHOUT CONTRAST TECHNIQUE: Multidetector CT imaging of the abdomen and pelvis was performed following the standard protocol without IV contrast. COMPARISON:  None. FINDINGS: Lower chest: No acute findings. Hepatobiliary: No mass visualized on this unenhanced exam. Moderate to severe diffuse hepatic steatosis is seen. Gallbladder is unremarkable. No evidence of biliary ductal dilatation. Pancreas: No mass or inflammatory process visualized on this unenhanced exam. Spleen:  Within normal limits in size. Adrenals/Urinary tract: A 9 mm right adrenal low-attenuation nodule is noted, consistent with a benign. No evidence of urolithiasis or hydronephrosis.  Unremarkable unopacified urinary bladder. Stomach/Bowel: No evidence of obstruction, inflammatory process, or abnormal fluid collections. Normal appendix visualized. Vascular/Lymphatic: No pathologically enlarged lymph nodes identified. No evidence of abdominal aortic aneurysm. Aortic atherosclerosis noted. Reproductive: Focal bulge the contour of posterior uterine body is seen consistent with a small fibroid measuring approximately 4 cm. Adnexal regions are unremarkable. Other:  None. Musculoskeletal:  No suspicious bone lesions identified. IMPRESSION: 1. No evidence of urolithiasis, hydronephrosis, or other acute findings. 2. Moderate to severe hepatic steatosis. 3. Probable small posterior uterine fibroid. Aortic Atherosclerosis (ICD10-I70.0). Electronically Signed   By: Marlaine Hind M.D.   On: 09/05/2019 10:33    Procedures Procedures (including critical care time)  Medications Ordered in ED Medications  ketorolac (TORADOL) 15 MG/ML injection 15 mg (15 mg Intravenous Given 09/05/19 1052)    ED Course  I have reviewed the triage vital signs and the nursing notes.  Pertinent labs & imaging results that were available during my care of the patient were reviewed by me and considered in my medical decision making (see chart for details).    MDM Rules/Calculators/A&P                          Christie Estrada is a 59 year old female with history of hypertension, diabetes who presents to the ED with right-sided flank/back pain.  Patient normal vitals.  No fever.  Pain on and off for the last 10 days.  Worse with movement.  Patient concerned about a kidney stone but denies any hematuria or history of kidney stones.  Does not have any loss of bowel or bladder, no saddle anesthesia, no urinary retention.  No midline spinal pain.  Mostly right CVA tenderness and right lower paraspinal pain in the lumbar area on exam.  No fever, no chills.  Likely MSK type pain.  No concern for cauda equina.  Normal  neurological exam.  After shared decision will evaluate for kidney stone/UTI/pyelonephritis.  Will get lab work, urinalysis, CT scan abdomen pelvis.  CT scan negative for kidney stones or other acute intra-abdominal process.  Does have some fatty liver but liver enzymes are normal.  Gallbladder enzymes normal.  Lipase normal.  Doubt pancreatitis.  No signs of urinary tract infection.  Overall likely MSK type pain.  We will refill her meloxicam to take as needed.  Recommend Tylenol.  Given Toradol shot.  Given return precautions and discharged in ED in good condition.  This chart was dictated using voice recognition software.  Despite best efforts to proofread,  errors can occur which can change the documentation meaning.    Final Clinical Impression(s) / ED Diagnoses Final diagnoses:  Acute right-sided low back pain without sciatica    Rx / DC Orders ED Discharge Orders         Ordered    meloxicam (MOBIC) 7.5 MG tablet  Daily PRN     Discontinue  Reprint     09/05/19 1053           Shawntell Dixson, DO 09/05/19 1054

## 2019-09-05 NOTE — ED Triage Notes (Signed)
Pt is here with right lumbar back pain x 10 days.

## 2020-06-24 IMAGING — MR MR SHOULDER*L* W/O CM
5 series · 35 of 40 positions shown · non-contrast
Comparison: Left shoulder x-rays dated November 11, 2007.

CLINICAL DATA: Chronic left shoulder pain and limited range of
motion for the past 4 months. No injury or prior surgery.

EXAM:
MRI OF THE LEFT SHOULDER WITHOUT CONTRAST
TECHNIQUE: Multiplanar, multisequence MR imaging of the shoulder was performed.
No intravenous contrast was administered.

[Series 3: PD fat-sat · axial · 4.0mm · 0.55mm/px · z∈[-69,+33]mm · 8 of 23 slices shown (1 of 2)]
[im 1/23]
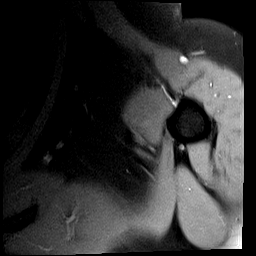
[im 3/23]
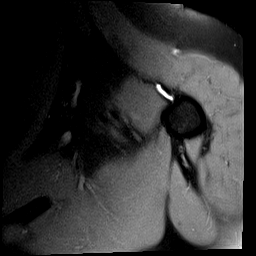
[im 8/23]
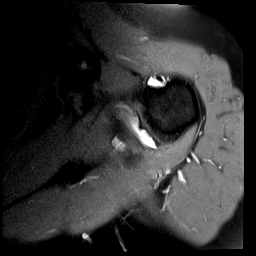
[im 10/23]
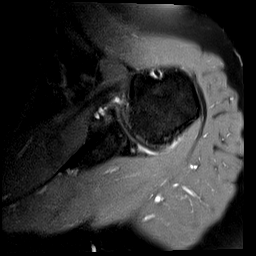
[im 13/23]
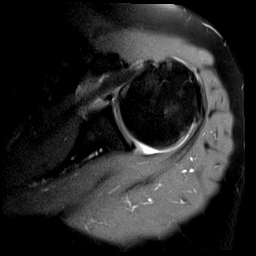
[im 15/23]
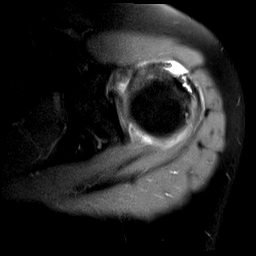
[im 20/23]
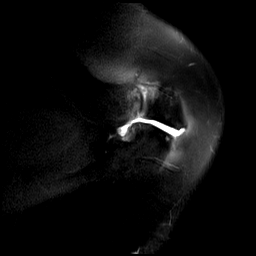
[im 23/23]
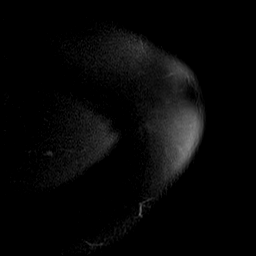

[Series 4: T1 · coronal · 4.0mm · 0.27mm/px · 6 of 23 slices shown]
[im 1/23]
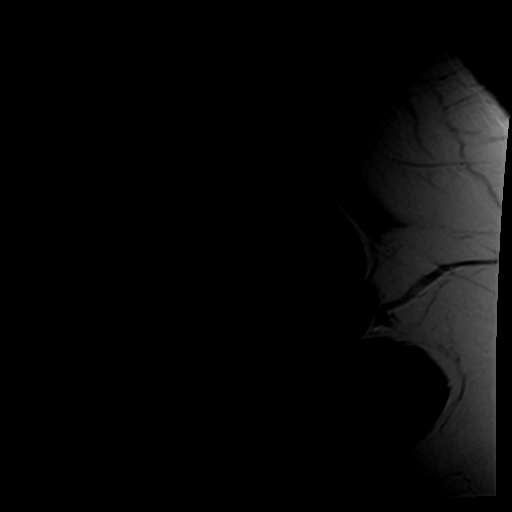
[im 3/23]
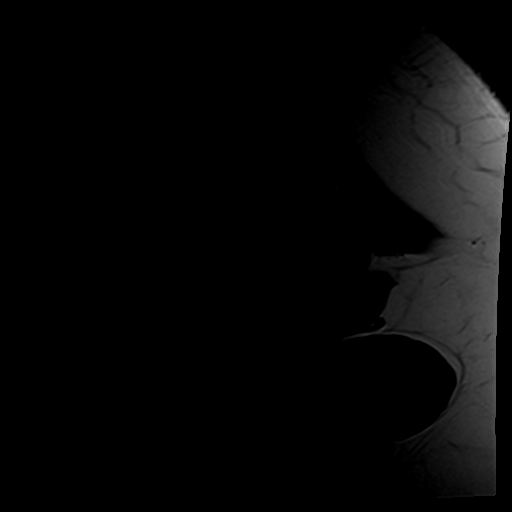
[im 6/23]
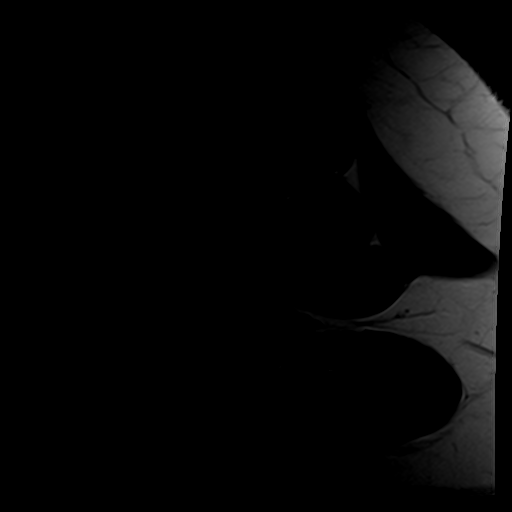
[im 9/23]
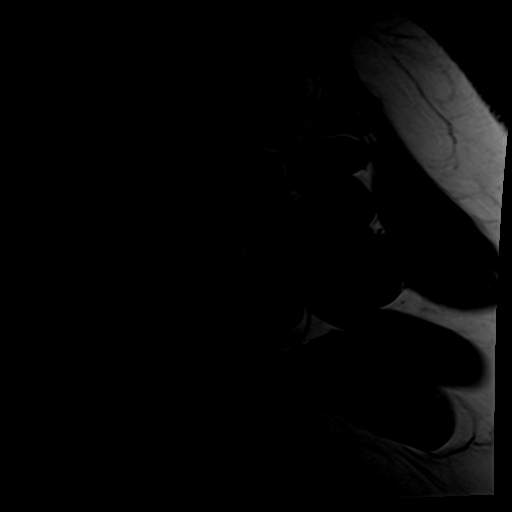
[im 14/23]
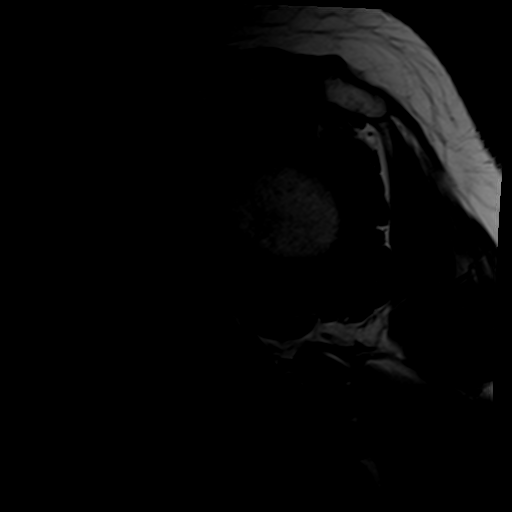
[im 17/23]
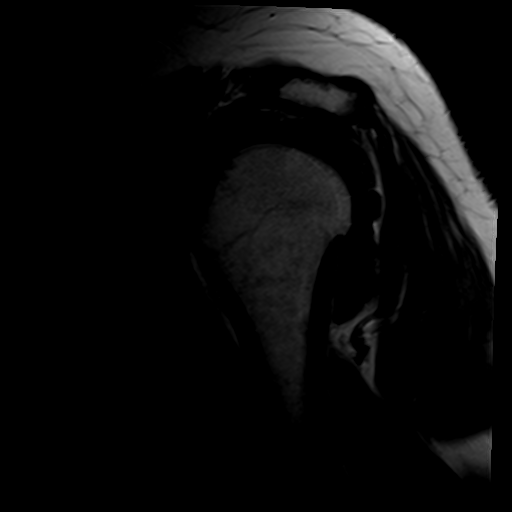

[Series 5: T2 fat-sat · coronal · 4.0mm · 0.55mm/px · 9 of 22 slices shown (1 of 2)]
[im 1/22]
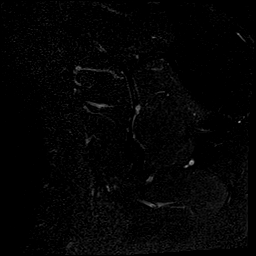
[im 3/22]
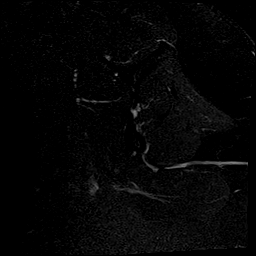
[im 6/22]
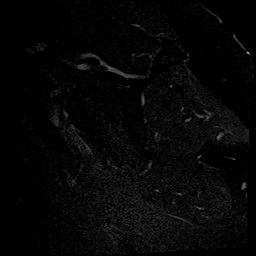
[im 8/22]
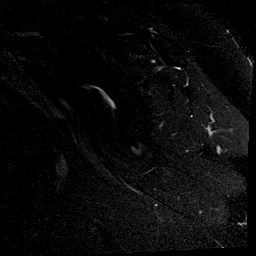
[im 11/22]
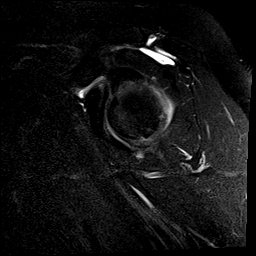
[im 14/22]
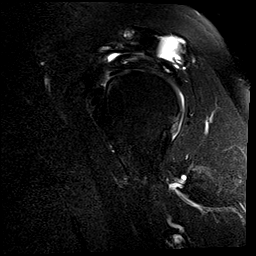
[im 16/22]
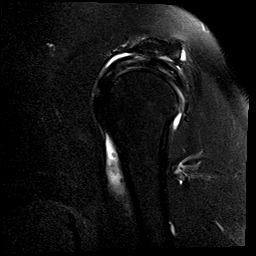
[im 19/22]
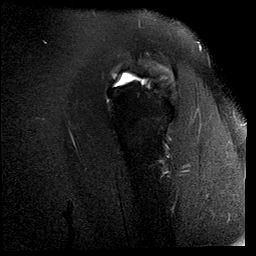
[im 22/22]
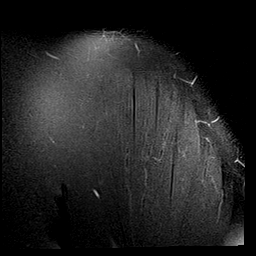

[Series 6: T2 fat-sat · oblique · 4.0mm · 0.55mm/px · 6 of 16 slices shown (2 of 2)]
[im 1/16]
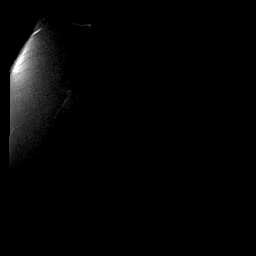
[im 4/16]
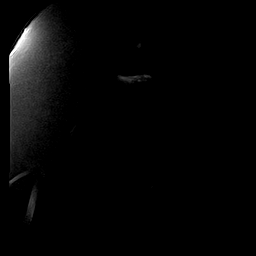
[im 7/16]
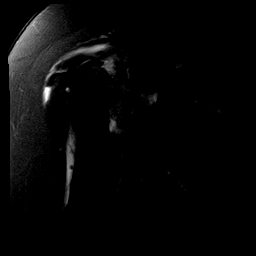
[im 10/16]
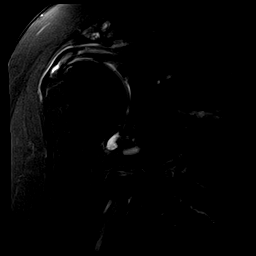
[im 13/16]
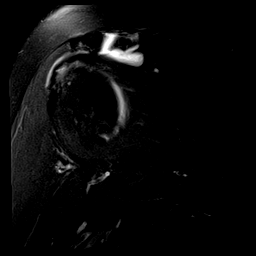
[im 16/16]
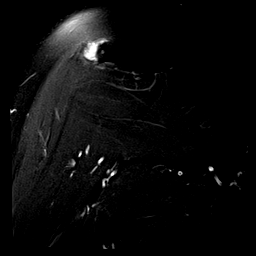

[Series 7: PD fat-sat · oblique · 4.0mm · 0.27mm/px · 6 of 16 slices shown (2 of 2)]
[im 1/16]
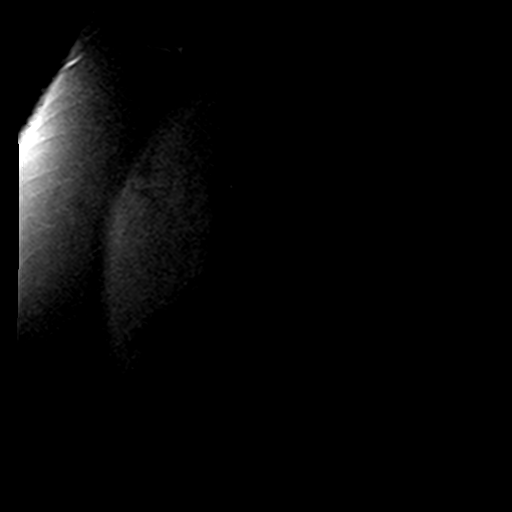
[im 4/16]
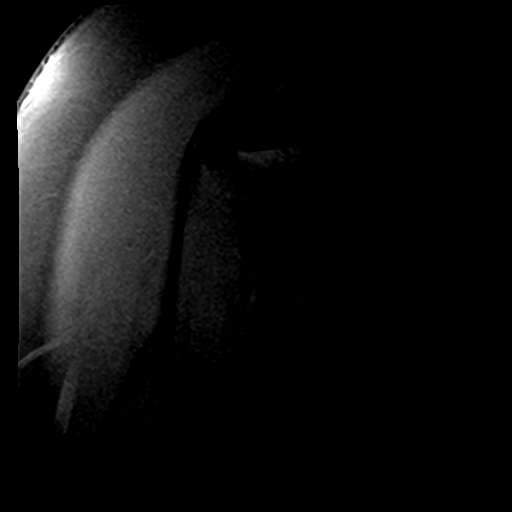
[im 7/16]
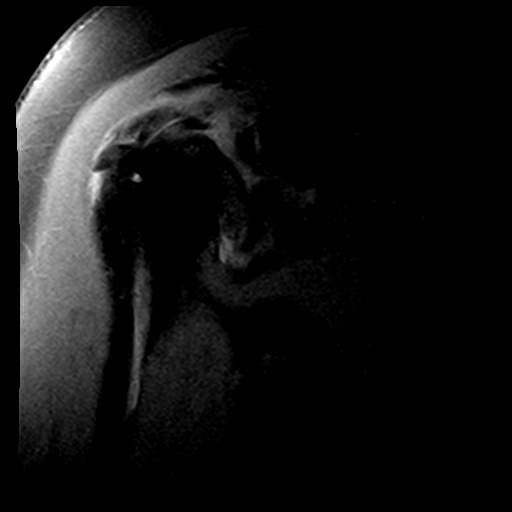
[im 10/16]
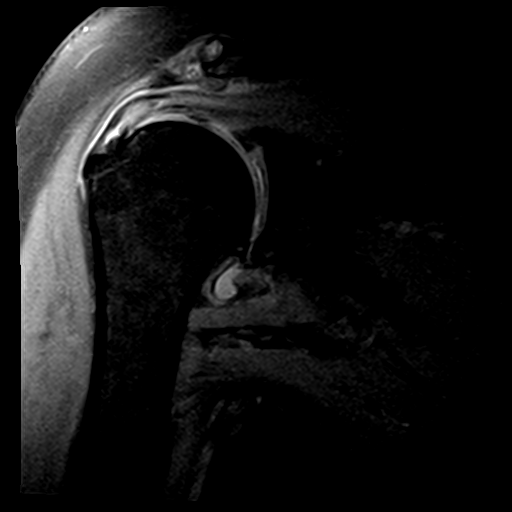
[im 13/16]
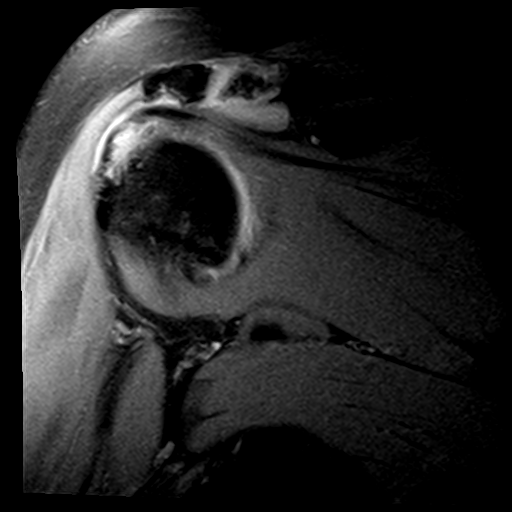
[im 16/16]
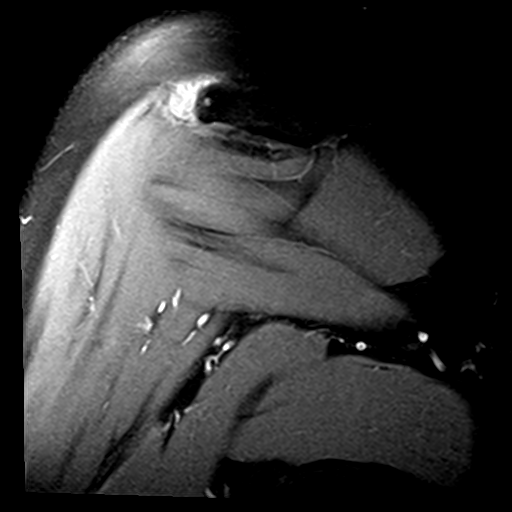

[35 of 40 positions shown; findings below may reference images not displayed]

FINDINGS: Rotator cuff: Moderate supraspinatus tendinosis with full-thickness,
partial width tear at the insertion. 8 mm retraction. Additional
intrasubstance tear of the posterior supraspinatus tendon at the
insertion. Moderate infraspinatus tendinosis with multiple small
intrasubstance tears. The teres minor and subscapularis tendons are
intact.

Muscles: No atrophy or abnormal signal of the muscles of the rotator
cuff.

Biceps long head:  Intact and normally positioned.

Acromioclavicular Joint: Mild arthropathy of the acromioclavicular
joint. Type I acromion. Os acromiale. Small amount of fluid in the
subacromial/subdeltoid bursa.

Glenohumeral Joint: Small joint effusion. Scattered
partial-thickness cartilage loss over the humeral head and inferior
glenoid. Marginal osteophytes.

Labrum:  Inferior labral tear with adjacent 1.3 cm paralabral cyst.

Bones:  No acute fracture or dislocation. No suspicious bone lesion.

Other: None.
IMPRESSION: 1. Moderate supraspinatus tendinosis with full-thickness, partial
width tear at the insertion. Intrasubstance extension into the
posterior tendon.
2. Moderate infraspinatus tendinosis with multiple small
intrasubstance tears.
3. Inferior labral tear with adjacent 1.3 cm paralabral cyst.
4. Mild acromioclavicular and mild to moderate glenohumeral
osteoarthritis.
5. Os acromiale.

## 2021-04-16 ENCOUNTER — Emergency Department (HOSPITAL_BASED_OUTPATIENT_CLINIC_OR_DEPARTMENT_OTHER): Payer: No Typology Code available for payment source

## 2021-04-16 ENCOUNTER — Encounter (HOSPITAL_BASED_OUTPATIENT_CLINIC_OR_DEPARTMENT_OTHER): Payer: Self-pay | Admitting: *Deleted

## 2021-04-16 ENCOUNTER — Emergency Department (HOSPITAL_BASED_OUTPATIENT_CLINIC_OR_DEPARTMENT_OTHER)
Admission: EM | Admit: 2021-04-16 | Discharge: 2021-04-16 | Disposition: A | Discharge status: Home / Self Care | Payer: No Typology Code available for payment source | Attending: Emergency Medicine | Admitting: Emergency Medicine

## 2021-04-16 DIAGNOSIS — J069 Acute upper respiratory infection, unspecified: Secondary | ICD-10-CM | POA: Insufficient documentation

## 2021-04-16 DIAGNOSIS — R059 Cough, unspecified: Secondary | ICD-10-CM | POA: Diagnosis present

## 2021-04-16 DIAGNOSIS — R1110 Vomiting, unspecified: Secondary | ICD-10-CM | POA: Diagnosis not present

## 2021-04-16 MED ORDER — HYDROCOD POLI-CHLORPHE POLI ER 10-8 MG/5ML PO SUER: 5.0000 mL | Freq: Once | ORAL | Status: DC

## 2021-04-16 MED ORDER — ONDANSETRON 4 MG PO TBDP
4.0000 mg | ORAL_TABLET | Freq: Once | ORAL | Status: AC
  Administered 2021-04-16: 4 mg via ORAL
  Filled 2021-04-16: qty 1

## 2021-04-16 MED ORDER — HYDROCOD POLI-CHLORPHE POLI ER 10-8 MG/5ML PO SUER: 5.0000 mL | Freq: Two times a day (BID) | ORAL | 0 refills | Status: AC | PRN

## 2021-04-16 MED ORDER — ONDANSETRON 4 MG PO TBDP: ORAL_TABLET | ORAL | 0 refills | Status: AC

## 2021-04-16 MED ORDER — HYDROCOD POLI-CHLORPHE POLI ER 10-8 MG/5ML PO SUER
5.0000 mL | Freq: Once | ORAL | Status: AC
  Administered 2021-04-16: 5 mL via ORAL
  Filled 2021-04-16: qty 5

## 2021-04-16 MED ORDER — ALBUTEROL SULFATE HFA 108 (90 BASE) MCG/ACT IN AERS
2.0000 | INHALATION_SPRAY | Freq: Once | RESPIRATORY_TRACT | Status: AC
  Administered 2021-04-16: 2 via RESPIRATORY_TRACT
  Filled 2021-04-16: qty 6.7

## 2021-04-16 NOTE — ED Notes (Signed)
RT assisted patient in getting undressed. SOB noted with exertion. Patient stated productive cough with frothy sputum for the past few days. SAT WNL. BBS clear and diminished in bases. Will assess again as needed

## 2021-04-16 NOTE — ED Triage Notes (Signed)
Began having cough and congestion last Sunday, began feeling better Wednesday, presents today due to cough being worse, having thick sputum and coughing so strong had vomiting episode

## 2021-04-16 NOTE — Discharge Instructions (Addendum)
Take tylenol 2 pills 4 times a day and motrin 4 pills 3 times a day.  Drink plenty of fluids.  Return for worsening shortness of breath, headache, confusion. Follow up with your family doctor.   

## 2021-04-16 NOTE — ED Provider Notes (Signed)
[facility] EMERGENCY DEPARTMENT Provider Note   CSN: [id number] Arrival date & time: 04/16/21  1025     History  Chief Complaint  Patient presents with   Cough    Christie Estrada is a 61 y.o. female.  61 yo F chief complaints of cough.  Is been going on for about a week and then she celebrated her birthday last night and then woke up this morning with a significant worsening cough.  Has had some posttussive emesis.  She feels like she has been coughing up some thick whitish sputum.  She felt like she had some mild specks of blood in it this morning.  Has difficulty breathing with coughing.  The history is provided by the patient.  Cough Associated symptoms: no chest pain, no chills, no fever, no headaches, no myalgias, no rhinorrhea, no shortness of breath and no wheezing       Home Medications Prior to Admission medications   Medication Sig Start Date End Date Taking? Authorizing Provider  chlorpheniramine-HYDROcodone (TUSSIONEX PENNKINETIC ER) 10-8 MG/5ML Take 5 mLs by mouth every 12 (twelve) hours as needed for cough. 04/16/21  Yes Deno Etienne, DO  ondansetron (ZOFRAN-ODT) 4 MG disintegrating tablet 4mg  ODT q4 hours prn nausea/vomit 04/16/21  Yes Deno Etienne, DO  albuterol (PROVENTIL HFA;VENTOLIN HFA) 108 (90 Base) MCG/ACT inhaler Inhale 2 puffs into the lungs every 6 (six) hours as needed for wheezing or shortness of breath. 03/24/16   Smiley Houseman, MD  azithromycin (ZITHROMAX) 250 MG tablet 500mg  (2 tablets) on day 1, then 250mg  daily for 4 more days. 03/24/16   Smiley Houseman, MD  cyclobenzaprine (FLEXERIL) 10 MG tablet Take 1 tablet (10 mg total) by mouth 3 (three) times daily as needed for muscle spasms. 05/12/12   Blanchie Dessert, MD  levothyroxine (SYNTHROID, LEVOTHROID) 125 MCG tablet Take 125 mcg by mouth daily. 06/13/11   Katherina Mires, MD  lisinopril-hydrochlorothiazide (PRINZIDE,ZESTORETIC) 20-12.5 MG per tablet Take 1 tablet by mouth daily.     [provider]  meloxicam (MOBIC) 7.5 MG tablet Take 1 tablet (7.5 mg total) by mouth daily as needed for up to 30 doses for pain. 09/05/19   Curatolo, Adam, DO  metFORMIN (GLUCOPHAGE) 1000 MG tablet Take 1,000 mg by mouth 2 (two) times daily with a meal.    [provider]  Multiple Vitamin (MULTIVITAMIN) capsule Take 1 capsule by mouth daily.    [provider]  oxyCODONE (ROXICODONE) 5 MG immediate release tablet Take 1 tablet (5 mg total) by mouth every 6 (six) hours as needed for pain. 05/12/12   Blanchie Dessert, MD  predniSONE (DELTASONE) 20 MG tablet Take 2 tablets (40 mg total) by mouth daily with breakfast. 03/24/16   Smiley Houseman, MD      Allergies    Oxycodone-acetaminophen    Review of Systems   Review of Systems  Constitutional:  Negative for chills and fever.  HENT:  Negative for congestion and rhinorrhea.   Eyes:  Negative for redness and visual disturbance.  Respiratory:  Positive for cough. Negative for shortness of breath and wheezing.   Cardiovascular:  Negative for chest pain and palpitations.  Gastrointestinal:  Negative for nausea and vomiting.  Genitourinary:  Negative for dysuria and urgency.  Musculoskeletal:  Negative for arthralgias and myalgias.  Skin:  Negative for pallor and wound.  Neurological:  Negative for dizziness and headaches.   Physical Exam Updated Vital Signs BP 117/67    Pulse 67  Temp 97.8 F (36.6 C) (Oral)    Resp 16    Ht 5\' 7"  (1.702 m)    Wt 114.8 kg    SpO2 94%    BMI 39.63 kg/m  Physical Exam Vitals and nursing note reviewed.  Constitutional:      General: She is not in acute distress.    Appearance: She is well-developed. She is not diaphoretic.  HENT:     Head: Normocephalic and atraumatic.  Eyes:     Pupils: Pupils are equal, round, and reactive to light.  Cardiovascular:     Rate and Rhythm: Normal rate and regular rhythm.     Heart sounds: No murmur heard.   No friction rub. No gallop.   Pulmonary:     Effort: Pulmonary effort is normal.     Breath sounds: No wheezing or rales.     Comments: Mildly diminished breath sounds in all fields.  No obvious wheezes. Abdominal:     General: There is no distension.     Palpations: Abdomen is soft.     Tenderness: There is no abdominal tenderness.  Musculoskeletal:        General: No tenderness.     Cervical back: Normal range of motion and neck supple.  Skin:    General: Skin is warm and dry.  Neurological:     Mental Status: She is alert and oriented to person, place, and time.  Psychiatric:        Behavior: Behavior normal.    ED Results / Procedures / Treatments   Labs (all labs ordered are listed, but only abnormal results are displayed) Labs Reviewed - No data to display  EKG None  Radiology DG Chest Portable 1 View  Result Date: 04/16/2021 CLINICAL DATA:  Cough.  Productive thick sputum for 1 week. EXAM: PORTABLE CHEST 1 VIEW COMPARISON:  03/24/2016 FINDINGS: Stable cardiomediastinal contours. No pleural effusion or edema. No airspace opacities identified. The visualized osseous structures appear intact. IMPRESSION: No active disease. Electronically Signed   By: Kerby Moors M.D.   On: 04/16/2021 11:08    Procedures Procedures    Medications Ordered in ED Medications  chlorpheniramine-HYDROcodone 10-8 MG/5ML suspension 5 mL (5 mLs Oral Given 04/16/21 1123)  albuterol (VENTOLIN HFA) 108 (90 Base) MCG/ACT inhaler 2 puff (2 puffs Inhalation Given 04/16/21 1123)  ondansetron (ZOFRAN-ODT) disintegrating tablet 4 mg (4 mg Oral Given 04/16/21 1122)    ED Course/ Medical Decision Making/ A&P                           Medical Decision Making Amount and/or Complexity of Data Reviewed Radiology: ordered.  Risk Prescription drug management.   Patient is a 61 y.o. female with a cc of cough.  Going on for about a week.  Worsening in the past 12 hours or so.  She has had pretty persistent cough for the past hour.   Felt like she has been having trouble catching her breath.  Mildly diminished lung sounds in all fields on my exam.  We will try 2 puffs of albuterol.  Chest x-ray independently interpreted by me without focal infiltrate or pneumothorax.  Radiology read also negative.  We will discharge home with antiemetics and cough medicine.  PCP follow-up.  11:27 AM:  I have discussed the diagnosis/risks/treatment options with the patient.  Evaluation and diagnostic testing in the emergency department does not suggest an emergent condition requiring admission or immediate intervention beyond what has  been performed at this time.  They will follow up with  PCP. We also discussed returning to the ED immediately if new or worsening sx occur. We discussed the sx which are most concerning (e.g., sudden worsening pain, fever, inability to tolerate by mouth) that necessitate immediate return. Medications administered to the patient during their visit and any new prescriptions provided to the patient are listed below.  Medications given during this visit Medications  chlorpheniramine-HYDROcodone 10-8 MG/5ML suspension 5 mL (5 mLs Oral Given 04/16/21 1123)  albuterol (VENTOLIN HFA) 108 (90 Base) MCG/ACT inhaler 2 puff (2 puffs Inhalation Given 04/16/21 1123)  ondansetron (ZOFRAN-ODT) disintegrating tablet 4 mg (4 mg Oral Given 04/16/21 1122)     The patient appears reasonably screen and/or stabilized for discharge and I doubt any other medical condition or other [facility] requiring further screening, evaluation, or treatment in the ED at this time prior to discharge.           Final Clinical Impression(s) / ED Diagnoses Final diagnoses:  Viral URI with cough    Rx / DC Orders ED Discharge Orders          Ordered    ondansetron (ZOFRAN-ODT) 4 MG disintegrating tablet        04/16/21 1124    chlorpheniramine-HYDROcodone (TUSSIONEX PENNKINETIC ER) 10-8 MG/5ML  Every 12 hours PRN        04/16/21 [place], [place], DO 04/16/21 1127

## 2023-01-06 ENCOUNTER — Emergency Department (HOSPITAL_BASED_OUTPATIENT_CLINIC_OR_DEPARTMENT_OTHER): Payer: Self-pay | Admitting: Radiology

## 2023-01-06 ENCOUNTER — Emergency Department (HOSPITAL_BASED_OUTPATIENT_CLINIC_OR_DEPARTMENT_OTHER)
Admission: EM | Admit: 2023-01-06 | Discharge: 2023-01-06 | Disposition: A | Discharge status: Home / Self Care | Payer: Self-pay

## 2023-01-06 ENCOUNTER — Encounter (HOSPITAL_BASED_OUTPATIENT_CLINIC_OR_DEPARTMENT_OTHER): Payer: Self-pay

## 2023-01-06 ENCOUNTER — Emergency Department (HOSPITAL_BASED_OUTPATIENT_CLINIC_OR_DEPARTMENT_OTHER)
Admission: EM | Admit: 2023-01-06 | Discharge: 2023-01-06 | Discharge status: Against Medical Advice | Payer: No Typology Code available for payment source | Attending: Emergency Medicine | Admitting: Emergency Medicine

## 2023-01-06 ENCOUNTER — Other Ambulatory Visit: Payer: Self-pay

## 2023-01-06 DIAGNOSIS — R519 Headache, unspecified: Secondary | ICD-10-CM | POA: Diagnosis not present

## 2023-01-06 DIAGNOSIS — R5383 Other fatigue: Secondary | ICD-10-CM | POA: Insufficient documentation

## 2023-01-06 DIAGNOSIS — R5381 Other malaise: Secondary | ICD-10-CM | POA: Diagnosis not present

## 2023-01-06 DIAGNOSIS — R059 Cough, unspecified: Secondary | ICD-10-CM | POA: Insufficient documentation

## 2023-01-06 DIAGNOSIS — E669 Obesity, unspecified: Secondary | ICD-10-CM | POA: Diagnosis not present

## 2023-01-06 DIAGNOSIS — Z1152 Encounter for screening for COVID-19: Secondary | ICD-10-CM | POA: Insufficient documentation

## 2023-01-06 DIAGNOSIS — J029 Acute pharyngitis, unspecified: Secondary | ICD-10-CM | POA: Diagnosis not present

## 2023-01-06 DIAGNOSIS — R0789 Other chest pain: Secondary | ICD-10-CM | POA: Insufficient documentation

## 2023-01-06 DIAGNOSIS — J069 Acute upper respiratory infection, unspecified: Secondary | ICD-10-CM

## 2023-01-06 DIAGNOSIS — Z5321 Procedure and treatment not carried out due to patient leaving prior to being seen by health care provider: Secondary | ICD-10-CM | POA: Insufficient documentation

## 2023-01-06 LAB — RESP PANEL BY RT-PCR (RSV, FLU A&B, COVID)  RVPGX2
Influenza A by PCR: NEGATIVE
Influenza A by PCR: NEGATIVE
Influenza B by PCR: NEGATIVE
Influenza B by PCR: NEGATIVE
Resp Syncytial Virus by PCR: NEGATIVE
Resp Syncytial Virus by PCR: NEGATIVE
SARS Coronavirus 2 by RT PCR: NEGATIVE
SARS Coronavirus 2 by RT PCR: NEGATIVE

## 2023-01-06 MED ORDER — BENZONATATE 100 MG PO CAPS: 100.0000 mg | ORAL_CAPSULE | Freq: Three times a day (TID) | ORAL | 0 refills | Status: DC | PRN

## 2023-01-06 MED ORDER — BENZONATATE 100 MG PO CAPS: 100.0000 mg | ORAL_CAPSULE | Freq: Three times a day (TID) | ORAL | 0 refills | Status: AC | PRN

## 2023-01-06 NOTE — Discharge Instructions (Signed)
Please follow-up with your primary doctor.  Return immediately if develop fevers, chills, worsening chest pain, shortness of breath, lightheadedness, passing out inability eat or drink due to nausea vomiting or you develop any new or worsening symptoms that are concerning to you.

## 2023-01-06 NOTE — ED Triage Notes (Signed)
Pt reports cough since last Tuesday and chest tightness since Friday. Difficulty taking deep breath. Pt hoarse and feels fatigued

## 2023-01-06 NOTE — ED Triage Notes (Signed)
Pt c/o chest tightness/ pressure, HA, sore throat, fatigue onset Tuesday. Started out at [facility], [facility].

## 2023-01-06 NOTE — ED Notes (Signed)
RT assessed in triage. Patient has had cough for days, hurts to take a deep breath. BBS clear, slightly diminished in bases. Croupy cough. RT to monitor

## 2023-01-06 NOTE — ED Provider Notes (Signed)
[place] EMERGENCY DEPARTMENT AT [facility] Provider Note   CSN: [id number] Arrival date & time: 01/06/23  1505     History  No chief complaint on file.   Christie Estrada is a 62 y.o. female.  Presenting emergency department with URI symptoms since Tuesday, productive cough, sore throat headache generalized malaise.  Reports some chest tightness that is worse after she coughs.  No reported fevers.  No nausea vomiting diarrhea.  Low risk for PE based on Wells criteria.        Home Medications Prior to Admission medications   Medication Sig Start Date End Date Taking? Authorizing Provider  albuterol (PROVENTIL HFA;VENTOLIN HFA) 108 (90 Base) MCG/ACT inhaler Inhale 2 puffs into the lungs every 6 (six) hours as needed for wheezing or shortness of breath. 03/24/16   Palma Holter, MD  azithromycin (ZITHROMAX) 250 MG tablet 500mg  (2 tablets) on day 1, then 250mg  daily for 4 more days. 03/24/16   Palma Holter, MD  chlorpheniramine-HYDROcodone (TUSSIONEX PENNKINETIC ER) 10-8 MG/5ML Take 5 mLs by mouth every 12 (twelve) hours as needed for cough. 04/16/21   Melene Plan, DO  cyclobenzaprine (FLEXERIL) 10 MG tablet Take 1 tablet (10 mg total) by mouth 3 (three) times daily as needed for muscle spasms. 05/12/12   Gwyneth Sprout, MD  levothyroxine (SYNTHROID, LEVOTHROID) 125 MCG tablet Take 125 mcg by mouth daily. 06/13/11   Macy Mis, MD  lisinopril-hydrochlorothiazide (PRINZIDE,ZESTORETIC) 20-12.5 MG per tablet Take 1 tablet by mouth daily.    [provider]  meloxicam (MOBIC) 7.5 MG tablet Take 1 tablet (7.5 mg total) by mouth daily as needed for up to 30 doses for pain. 09/05/19   Curatolo, Adam, DO  metFORMIN (GLUCOPHAGE) 1000 MG tablet Take 1,000 mg by mouth 2 (two) times daily with a meal.    [provider]  Multiple Vitamin (MULTIVITAMIN) capsule Take 1 capsule by mouth daily.    [provider]  ondansetron (ZOFRAN-ODT) 4 MG  disintegrating tablet 4mg  ODT q4 hours prn nausea/vomit 04/16/21   Melene Plan, DO  oxyCODONE (ROXICODONE) 5 MG immediate release tablet Take 1 tablet (5 mg total) by mouth every 6 (six) hours as needed for pain. 05/12/12   Gwyneth Sprout, MD  predniSONE (DELTASONE) 20 MG tablet Take 2 tablets (40 mg total) by mouth daily with breakfast. 03/24/16   Palma Holter, MD      Allergies    Oxycodone-acetaminophen    Review of Systems   Review of Systems  Physical Exam Updated Vital Signs BP (!) 157/76   Pulse 60   Temp 98.9 F (37.2 C)   Resp 15   SpO2 99%  Physical Exam Vitals and nursing note reviewed.  Constitutional:      General: She is not in acute distress.    Appearance: She is obese. She is not toxic-appearing.  HENT:     Head: Normocephalic.  Eyes:     Conjunctiva/sclera: Conjunctivae normal.  Cardiovascular:     Rate and Rhythm: Normal rate and regular rhythm.     Pulses: Normal pulses.  Pulmonary:     Effort: Pulmonary effort is normal. No respiratory distress.     Breath sounds: Normal breath sounds. No wheezing, rhonchi or rales.  Abdominal:     General: Abdomen is flat. There is no distension.     Tenderness: There is no abdominal tenderness. There is no guarding or rebound.  Musculoskeletal:        General: Normal range  of motion.     Cervical back: Normal range of motion.  Skin:    General: Skin is warm and dry.     Capillary Refill: Capillary refill takes less than 2 seconds.  Neurological:     Mental Status: She is alert and oriented to person, place, and time.  Psychiatric:        Mood and Affect: Mood normal.        Behavior: Behavior normal.     ED Results / Procedures / Treatments   Labs (all labs ordered are listed, but only abnormal results are displayed) Labs Reviewed  RESP PANEL BY RT-PCR (RSV, FLU A&B, COVID)  [id number]    EKG EKG Interpretation Date/Time:  Sunday January 06 2023 15:29:56 EDT Ventricular Rate:  67 PR  Interval:  183 QRS Duration:  94 QT Interval:  399 QTC Calculation: 422 R Axis:   -8  Text Interpretation: Sinus rhythm Baseline wander in lead(s) V5 Confirmed by Estanislado Pandy [phone]) on 01/06/2023 3:43:44 PM  Radiology DG Chest 2 View  Result Date: 01/06/2023 CLINICAL DATA:  Chest tightness and pressure EXAM: CHEST - 2 VIEW COMPARISON:  04/16/2021 FINDINGS: Normal heart size and mediastinal contours. No acute infiltrate or edema. No effusion or pneumothorax. No acute osseous findings. IMPRESSION: No active cardiopulmonary disease. Electronically Signed   By: Tiburcio Pea M.D.   On: 01/06/2023 16:16    Procedures Procedures    Medications Ordered in ED Medications - No data to display  ED Course/ Medical Decision Making/ A&P Clinical Course as of 01/06/23 1640  Sun Jan 06, 2023  1623 DG Chest 2 View IMPRESSION: No active cardiopulmonary disease   [TY]  1623 Resp panel by RT-PCR (RSV, Flu A&B, Covid) Anterior Nasal Swab IMPRESSION: No active cardiopulmonary disease   [TY]    Clinical Course User Index [TY] Coral Spikes, DO                                 Medical Decision Making Is a 62 year old female presenting emergency department for evaluation of possible pneumonia in the setting of URI symptoms.  She is afebrile nontachycardic slightly hypertensive.  She is maintaining her oxygen saturation on room air.  She has clear lungs and does not appear to be in respiratory distress, speaking in full sentences.  Her symptoms today sound likely viral in nature.  Therefore flu/COVID/RSV were ordered and were negative.  She was current concern for possible pneumonia, however chest x-ray on my independent interpretation with no overt findings; radiology agrees.  EKG appears to be normal sinus rhythm with no ST segment changes to indicate ischemia; symptoms also not consistent with ACS.  Low risk for PE based on Wells criteria.  Discussed supportive care for likely viral  URI/bronchitis.  Patient was agreeable plan and will follow-up with PCP.  Will discharge with Tessalon Perles.  Stable for discharge at this time  Amount and/or Complexity of Data Reviewed Labs:  Decision-making details documented in ED Course. Radiology: ordered. Decision-making details documented in ED Course. ECG/medicine tests: ordered.          Final Clinical Impression(s) / ED Diagnoses Final diagnoses:  None    Rx / DC Orders ED Discharge Orders     None         Coral Spikes, DO 01/06/23 1640

## 2023-01-06 NOTE — ED Notes (Addendum)
Pt decided after being triage she was going to [facility] and see if wait was shorter

## 2024-02-03 ENCOUNTER — Other Ambulatory Visit: Payer: Self-pay | Admitting: Internal Medicine

## 2024-02-03 DIAGNOSIS — R928 Other abnormal and inconclusive findings on diagnostic imaging of breast: Secondary | ICD-10-CM

## 2024-02-13 ENCOUNTER — Other Ambulatory Visit: Payer: Self-pay | Admitting: Internal Medicine

## 2024-02-13 ENCOUNTER — Ambulatory Visit
Admission: RE | Admit: 2024-02-13 | Discharge: 2024-02-13 | Disposition: A | Source: Ambulatory Visit | Attending: Internal Medicine | Admitting: Internal Medicine

## 2024-02-13 DIAGNOSIS — R928 Other abnormal and inconclusive findings on diagnostic imaging of breast: Secondary | ICD-10-CM

## 2024-02-13 DIAGNOSIS — R599 Enlarged lymph nodes, unspecified: Secondary | ICD-10-CM

## 2024-02-28 ENCOUNTER — Other Ambulatory Visit
